# Patient Record
Sex: Female | Born: 1980 | Race: Black or African American | Hispanic: No | Marital: Single | Smoking: Current some day smoker
Health system: Southern US, Community
[De-identification: ages and names within clinical notes are randomized; demographics above are authoritative.]

## PROBLEM LIST (undated history)

## (undated) DIAGNOSIS — F1910 Other psychoactive substance abuse, uncomplicated: Secondary | ICD-10-CM

## (undated) DIAGNOSIS — C539 Malignant neoplasm of cervix uteri, unspecified: Secondary | ICD-10-CM

## (undated) HISTORY — PX: TUBAL LIGATION: SHX77

---

## 2016-09-15 ENCOUNTER — Encounter (HOSPITAL_COMMUNITY): Payer: Self-pay | Admitting: Emergency Medicine

## 2016-09-15 ENCOUNTER — Emergency Department (HOSPITAL_COMMUNITY): Payer: Medicaid Other

## 2016-09-15 DIAGNOSIS — F17210 Nicotine dependence, cigarettes, uncomplicated: Secondary | ICD-10-CM | POA: Insufficient documentation

## 2016-09-15 DIAGNOSIS — Z8541 Personal history of malignant neoplasm of cervix uteri: Secondary | ICD-10-CM | POA: Insufficient documentation

## 2016-09-15 DIAGNOSIS — R0789 Other chest pain: Secondary | ICD-10-CM | POA: Diagnosis not present

## 2016-09-15 DIAGNOSIS — R079 Chest pain, unspecified: Secondary | ICD-10-CM | POA: Diagnosis present

## 2016-09-15 LAB — COMPREHENSIVE METABOLIC PANEL
ALBUMIN: 3.8 g/dL (ref 3.5–5.0)
ALK PHOS: 81 U/L (ref 38–126)
ALT: 91 U/L — ABNORMAL HIGH (ref 14–54)
ANION GAP: 8 (ref 5–15)
AST: 65 U/L — ABNORMAL HIGH (ref 15–41)
BILIRUBIN TOTAL: 0.5 mg/dL (ref 0.3–1.2)
BUN: 17 mg/dL (ref 6–20)
CALCIUM: 9.3 mg/dL (ref 8.9–10.3)
CO2: 26 mmol/L (ref 22–32)
Chloride: 103 mmol/L (ref 101–111)
Creatinine, Ser: 0.91 mg/dL (ref 0.44–1.00)
GLUCOSE: 108 mg/dL — AB (ref 65–99)
Potassium: 4.4 mmol/L (ref 3.5–5.1)
Sodium: 137 mmol/L (ref 135–145)
TOTAL PROTEIN: 7.5 g/dL (ref 6.5–8.1)

## 2016-09-15 LAB — CBC
HCT: 42.3 % (ref 36.0–46.0)
HEMOGLOBIN: 14 g/dL (ref 12.0–15.0)
MCH: 29 pg (ref 26.0–34.0)
MCHC: 33.1 g/dL (ref 30.0–36.0)
MCV: 87.6 fL (ref 78.0–100.0)
Platelets: 231 10*3/uL (ref 150–400)
RBC: 4.83 MIL/uL (ref 3.87–5.11)
RDW: 14.7 % (ref 11.5–15.5)
WBC: 10.1 10*3/uL (ref 4.0–10.5)

## 2016-09-15 LAB — LIPASE, BLOOD: Lipase: 28 U/L (ref 11–51)

## 2016-09-15 LAB — I-STAT TROPONIN, ED: TROPONIN I, POC: 0 ng/mL (ref 0.00–0.08)

## 2016-09-15 MED ORDER — ONDANSETRON 4 MG PO TBDP
4.0000 mg | ORAL_TABLET | Freq: Once | ORAL | Status: AC | PRN
  Administered 2016-09-15: 4 mg via ORAL
  Filled 2016-09-15: qty 1

## 2016-09-15 NOTE — ED Triage Notes (Signed)
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

## 2016-09-15 NOTE — ED Triage Notes (Signed)
Pt comes from home via EMS with complaints of abdominal pain, chest pain, and left shoulder pain for the past 3 days.  Was diagnosed with pneumonia 3 weeks ago but did not take her antibiotics because she was "strung out on drugs".

## 2016-09-16 ENCOUNTER — Encounter (HOSPITAL_COMMUNITY): Payer: Self-pay

## 2016-09-16 ENCOUNTER — Emergency Department (HOSPITAL_COMMUNITY)
Admission: EM | Admit: 2016-09-16 | Discharge: 2016-09-16 | Disposition: A | Discharge status: Home / Self Care | Payer: Medicaid Other | Attending: Emergency Medicine | Admitting: Emergency Medicine

## 2016-09-16 ENCOUNTER — Emergency Department (HOSPITAL_COMMUNITY): Payer: Medicaid Other

## 2016-09-16 DIAGNOSIS — R0789 Other chest pain: Secondary | ICD-10-CM

## 2016-09-16 HISTORY — DX: Malignant neoplasm of cervix uteri, unspecified: C53.9

## 2016-09-16 HISTORY — DX: Other psychoactive substance abuse, uncomplicated: F19.10

## 2016-09-16 LAB — GC/CHLAMYDIA PROBE AMP (~~LOC~~) NOT AT ARMC
Chlamydia: NEGATIVE
Neisseria Gonorrhea: NEGATIVE

## 2016-09-16 LAB — URINALYSIS, ROUTINE W REFLEX MICROSCOPIC
Bilirubin Urine: NEGATIVE
GLUCOSE, UA: NEGATIVE mg/dL
HGB URINE DIPSTICK: NEGATIVE
KETONES UR: NEGATIVE mg/dL
Leukocytes, UA: NEGATIVE
Nitrite: NEGATIVE
PROTEIN: NEGATIVE mg/dL
Specific Gravity, Urine: 1.025 (ref 1.005–1.030)
pH: 7.5 (ref 5.0–8.0)

## 2016-09-16 LAB — WET PREP, GENITAL
Clue Cells Wet Prep HPF POC: NONE SEEN
SPERM: NONE SEEN
Trich, Wet Prep: NONE SEEN
Yeast Wet Prep HPF POC: NONE SEEN

## 2016-09-16 LAB — POC URINE PREG, ED: PREG TEST UR: NEGATIVE

## 2016-09-16 MED ORDER — METHOCARBAMOL 500 MG PO TABS: 500.0000 mg | ORAL_TABLET | Freq: Two times a day (BID) | ORAL | 0 refills | Status: DC

## 2016-09-16 MED ORDER — IOPAMIDOL (ISOVUE-370) INJECTION 76%
INTRAVENOUS | Status: AC
  Filled 2016-09-16: qty 100

## 2016-09-16 MED ORDER — SODIUM CHLORIDE 0.9 % IJ SOLN
INTRAMUSCULAR | Status: AC
  Filled 2016-09-16: qty 50

## 2016-09-16 MED ORDER — KETOROLAC TROMETHAMINE 60 MG/2ML IM SOLN
60.0000 mg | Freq: Once | INTRAMUSCULAR | Status: AC
  Administered 2016-09-16: 60 mg via INTRAMUSCULAR
  Filled 2016-09-16: qty 2

## 2016-09-16 MED ORDER — IOPAMIDOL (ISOVUE-370) INJECTION 76%
100.0000 mL | Freq: Once | INTRAVENOUS | Status: AC | PRN
  Administered 2016-09-16: 100 mL via INTRAVENOUS

## 2016-09-16 MED ORDER — NAPROXEN 375 MG PO TABS: 375.0000 mg | ORAL_TABLET | Freq: Two times a day (BID) | ORAL | 0 refills | Status: DC

## 2016-09-16 NOTE — ED Provider Notes (Signed)
[facility] DEPT Provider Note   CSN: [id number] Arrival date & time: 09/15/16  2101  By signing my name below, I, Beverly Hanna, attest that this documentation has been prepared under the direction and in the presence of [name], MD.  Electronically Signed: Julien Hanna, ED Scribe. 09/16/16. 2:01 AM.    History   Chief Complaint Chief Complaint  Patient presents with  . Abdominal Pain  . Chest Pain    The history is provided by the patient and the EMS personnel. No language interpreter was used.  Abdominal Pain   This is a new problem. The current episode started more than 2 days ago. The problem occurs rarely. The problem has been gradually worsening. The pain is associated with an unknown factor. The pain is located in the suprapubic region. The pain is moderate. Pertinent negatives include diarrhea and dysuria. Nothing aggravates the symptoms. Nothing relieves the symptoms. Past workup includes CT scan. Her past medical history does not include ulcerative colitis.  Chest Pain   This is a new problem. The current episode started more than 2 days ago. The problem occurs rarely. The problem has been gradually worsening. The pain is moderate. The pain radiates to the upper back. Associated symptoms include abdominal pain. Pertinent negatives include no diaphoresis and no shortness of breath. She has tried nothing for the symptoms. The treatment provided no relief. Risk factors include substance abuse.  Her past medical history is significant for cancer.   HPI Comments: Beverly Hanna is a 35 y.o. female who has a PMHx cervical cancer and substance abuse presents to the Emergency Department by EMS complaining of sudden, gradual worsening, left upper chest pain that radiates into her left shoulder x 3 days. She reports associated suprapubic abdominal pain, vaginal discharge, and abnormal loose stool. Pt has been cancer free for 1.5 years and received radiation and chemotherapy.  She is a recovering drug addict and has been drug free for the past 3 weeks. She has an abdominal surgical hx of tubal ligation. Pt is not on any birth control and has not had any recent prolonged travel or surgeries. She has not has any injury to the area. Denies dysuria and constipation.  Past Medical History:  Diagnosis Date  . Cervical cancer ([place])   . Substance abuse     There are no active problems to display for this patient.   History reviewed. No pertinent surgical history.  OB History    No data available       Home Medications    Prior to Admission medications   Not on File    Family History No family history on file.  Social History Social History  Substance Use Topics  . Smoking status: Current Some Day Smoker    Types: Cigarettes  . Smokeless tobacco: Never Used  . Alcohol use Yes     Comment: 3 weeks clean     Allergies   Patient has no known allergies.   Review of Systems Review of Systems  Constitutional: Negative for diaphoresis.  Respiratory: Negative for shortness of breath.   Cardiovascular: Positive for chest pain.  Gastrointestinal: Positive for abdominal pain. Negative for blood in stool and diarrhea.  Genitourinary: Positive for vaginal discharge. Negative for difficulty urinating, dysuria and genital sores.  All other systems reviewed and are negative.    Physical Exam Updated Vital Signs BP 103/82   Pulse 113   Temp 98 F (36.7 C) (Oral)   Resp 18   SpO2 100%  Physical Exam  Constitutional: She is oriented to person, place, and time. She appears well-developed and well-nourished.  HENT:  Head: Normocephalic and atraumatic.  Mouth/Throat: Oropharynx is clear and moist. No oropharyngeal exudate.  Eyes: Conjunctivae and EOM are normal. Pupils are equal, round, and reactive to light.  Neck: Normal range of motion. Neck supple. No JVD present. No tracheal deviation present.  No carotid bruits. Trachea midline.     Cardiovascular: Normal rate, regular rhythm and normal heart sounds.  Exam reveals no gallop and no friction rub.   No murmur heard. RRR.   Pulmonary/Chest: Effort normal and breath sounds normal. No stridor. No respiratory distress. She has no wheezes. She has no rales. She exhibits tenderness.  Lungs CTA bilaterally.   Abdominal: Soft. Bowel sounds are normal. She exhibits no distension. There is no rebound and no guarding.  Musculoskeletal: Normal range of motion.  Lymphadenopathy:    She has no cervical adenopathy.  Neurological: She is alert and oriented to person, place, and time. She has normal reflexes. She displays normal reflexes.  Skin: Skin is warm and dry. Capillary refill takes less than 2 seconds.  Psychiatric: She has a normal mood and affect.  Nursing note and vitals reviewed.    ED Treatments / Results   Vitals:   09/15/16 2112  BP: 103/82  Pulse: 113  Resp: 18  Temp: 98 F (36.7 C)    DIAGNOSTIC STUDIES: Oxygen Saturation is 100% on RA, normal by my interpretation.  COORDINATION OF CARE:  1:55 AM Discussed treatment plan with pt at bedside and pt agreed to plan.  Results for orders placed or performed during the hospital encounter of 09/16/16  Lipase, blood  Result Value Ref Range   Lipase 28 11 - 51 U/L  Comprehensive metabolic panel  Result Value Ref Range   Sodium 137 135 - 145 mmol/L   Potassium 4.4 3.5 - 5.1 mmol/L   Chloride 103 101 - 111 mmol/L   CO2 26 22 - 32 mmol/L   Glucose, Bld 108 (H) 65 - 99 mg/dL   BUN 17 6 - 20 mg/dL   Creatinine, Ser 0.91 0.44 - 1.00 mg/dL   Calcium 9.3 8.9 - 10.3 mg/dL   Total Protein 7.5 6.5 - 8.1 g/dL   Albumin 3.8 3.5 - 5.0 g/dL   AST 65 (H) 15 - 41 U/L   ALT 91 (H) 14 - 54 U/L   Alkaline Phosphatase 81 38 - 126 U/L   Total Bilirubin 0.5 0.3 - 1.2 mg/dL   GFR calc non Af Amer >60 >60 mL/min   GFR calc Af Amer >60 >60 mL/min   Anion gap 8 5 - 15  CBC  Result Value Ref Range   WBC 10.1 4.0 - 10.5 K/uL    RBC 4.83 3.87 - 5.11 MIL/uL   Hemoglobin 14.0 12.0 - 15.0 g/dL   HCT 42.3 36.0 - 46.0 %   MCV 87.6 78.0 - 100.0 fL   MCH 29.0 26.0 - 34.0 pg   MCHC 33.1 30.0 - 36.0 g/dL   RDW 14.7 11.5 - 15.5 %   Platelets 231 150 - 400 K/uL  Urinalysis, Routine w reflex microscopic (not at [facility])  Result Value Ref Range   Color, Urine YELLOW YELLOW   APPearance CLEAR CLEAR   Specific Gravity, Urine 1.025 1.005 - 1.030   pH 7.5 5.0 - 8.0   Glucose, UA NEGATIVE NEGATIVE mg/dL   Hgb urine dipstick NEGATIVE NEGATIVE   Bilirubin Urine NEGATIVE NEGATIVE  Ketones, ur NEGATIVE NEGATIVE mg/dL   Protein, ur NEGATIVE NEGATIVE mg/dL   Nitrite NEGATIVE NEGATIVE   Leukocytes, UA NEGATIVE NEGATIVE  I-stat troponin, ED  Result Value Ref Range   Troponin i, poc 0.00 0.00 - 0.08 ng/mL   Comment 3          POC urine preg, ED (not at [facility])  Result Value Ref Range   Preg Test, Ur NEGATIVE NEGATIVE   Dg Chest 2 View  Result Date: 09/15/2016 CLINICAL DATA:  Left-side chest pain, shortness of breath on exertion and headache for 2 days. EXAM: CHEST  2 VIEW COMPARISON:  None. FINDINGS: Lungs are clear. Heart size is normal. No pneumothorax or pleural effusion. No bony abnormality. IMPRESSION: Negative chest. Electronically Signed   By: Inge Rise M.D.   On: 09/15/2016 21:52   Ct Angio Chest Pe W And/or Wo Contrast  Result Date: 09/16/2016 CLINICAL DATA:  Left upper chest pain radiating to the shoulder for 3 days. Suprapubic abdominal pain. EXAM: CT ANGIOGRAPHY CHEST CT ABDOMEN AND PELVIS WITH CONTRAST TECHNIQUE: Multidetector CT imaging of the chest was performed using the standard protocol during bolus administration of intravenous contrast. Multiplanar CT image reconstructions and MIPs were obtained to evaluate the vascular anatomy. Multidetector CT imaging of the abdomen and pelvis was performed using the standard protocol during bolus administration of intravenous contrast. CONTRAST:  100 mL Isovue 370  intravenous COMPARISON:  None. FINDINGS: CTA CHEST FINDINGS Cardiovascular: Satisfactory opacification of the pulmonary arteries to the segmental level. No evidence of pulmonary embolism. Normal heart size. No pericardial effusion. Mediastinum/Nodes: No enlarged mediastinal, hilar, or axillary lymph nodes. Thyroid gland, trachea, and esophagus demonstrate no significant findings. Lungs/Pleura: Lungs are clear. No pleural effusion or pneumothorax. Musculoskeletal: No chest wall abnormality. No acute or significant osseous findings. Review of the MIP images confirms the above findings. CT ABDOMEN and PELVIS FINDINGS Hepatobiliary: No focal liver abnormality is seen. No gallstones, gallbladder wall thickening, or biliary dilatation. Pancreas: Unremarkable. No pancreatic ductal dilatation or surrounding inflammatory changes. Spleen: Normal in size without focal abnormality. Adrenals/Urinary Tract: Adrenal glands are unremarkable. Kidneys are normal, without renal calculi, focal lesion, or hydronephrosis. Bladder is unremarkable. Stomach/Bowel: Stomach is within normal limits. Appendix is normal. No evidence of bowel wall thickening, distention, or inflammatory changes. Vascular/Lymphatic: No significant vascular findings are present. No enlarged abdominal or pelvic lymph nodes. Reproductive: Uterus and bilateral adnexa are unremarkable. Other: No acute inflammatory changes.  No ascites. Musculoskeletal: No acute or significant osseous findings. Review of the MIP images confirms the above findings. IMPRESSION: No significant abnormality. Electronically Signed   By: Andreas Newport M.D.   On: 09/16/2016 04:39   Ct Abdomen Pelvis W Contrast  Result Date: 09/16/2016 CLINICAL DATA:  Left upper chest pain radiating to the shoulder for 3 days. Suprapubic abdominal pain. EXAM: CT ANGIOGRAPHY CHEST CT ABDOMEN AND PELVIS WITH CONTRAST TECHNIQUE: Multidetector CT imaging of the chest was performed using the standard protocol  during bolus administration of intravenous contrast. Multiplanar CT image reconstructions and MIPs were obtained to evaluate the vascular anatomy. Multidetector CT imaging of the abdomen and pelvis was performed using the standard protocol during bolus administration of intravenous contrast. CONTRAST:  100 mL Isovue 370 intravenous COMPARISON:  None. FINDINGS: CTA CHEST FINDINGS Cardiovascular: Satisfactory opacification of the pulmonary arteries to the segmental level. No evidence of pulmonary embolism. Normal heart size. No pericardial effusion. Mediastinum/Nodes: No enlarged mediastinal, hilar, or axillary lymph nodes. Thyroid gland, trachea, and esophagus demonstrate no significant  findings. Lungs/Pleura: Lungs are clear. No pleural effusion or pneumothorax. Musculoskeletal: No chest wall abnormality. No acute or significant osseous findings. Review of the MIP images confirms the above findings. CT ABDOMEN and PELVIS FINDINGS Hepatobiliary: No focal liver abnormality is seen. No gallstones, gallbladder wall thickening, or biliary dilatation. Pancreas: Unremarkable. No pancreatic ductal dilatation or surrounding inflammatory changes. Spleen: Normal in size without focal abnormality. Adrenals/Urinary Tract: Adrenal glands are unremarkable. Kidneys are normal, without renal calculi, focal lesion, or hydronephrosis. Bladder is unremarkable. Stomach/Bowel: Stomach is within normal limits. Appendix is normal. No evidence of bowel wall thickening, distention, or inflammatory changes. Vascular/Lymphatic: No significant vascular findings are present. No enlarged abdominal or pelvic lymph nodes. Reproductive: Uterus and bilateral adnexa are unremarkable. Other: No acute inflammatory changes.  No ascites. Musculoskeletal: No acute or significant osseous findings. Review of the MIP images confirms the above findings. IMPRESSION: No significant abnormality. Electronically Signed   By: Andreas Newport M.D.   On: 09/16/2016  04:39    EKG  EKG Interpretation  Date/Time:  Monday September 15 2016 21:19:02 EST Ventricular Rate:  112 PR Interval:    QRS Duration: 83 QT Interval:  321 QTC Calculation: 439 R Axis:   79 Text Interpretation:  Sinus tachycardia Confirmed by [facility]  MD, Suhani Stillion ([id number]) on 09/16/2016 1:45:47 AM        Procedures (including critical care time)  Medications Ordered in ED Medications  ondansetron (ZOFRAN-ODT) disintegrating tablet 4 mg (4 mg Oral Given 09/15/16 2126)   Medications  sodium chloride 0.9 % injection (not administered)  iopamidol (ISOVUE-370) 76 % injection (not administered)  ondansetron (ZOFRAN-ODT) disintegrating tablet 4 mg (4 mg Oral Given 09/15/16 2126)  ketorolac (TORADOL) injection 60 mg (60 mg Intramuscular Given 09/16/16 0229)  iopamidol (ISOVUE-370) 76 % injection 100 mL (100 mLs Intravenous Contrast Given 09/16/16 0407)     Final Clinical Impressions(s) / ED Diagnoses  Patient is very pleased with care.  I suspect she wanted to make sure her cancer had not recurred.  She now admits to doing a lot of heavy lifting before the pain in her chest and groin started.  No PE on CT.  Ruled out for MI with negative EKG and troponin and > 24 hours of continuous pain.  Pain is also highly reproducible and consistent will MSK pain.  Will treat for same.  All questions answered to patient's satisfaction. Based on history and exam patient has been appropriately medically screened and emergency conditions excluded. Patient is stable for discharge at this time. Follow up with your PMD for recheck in 2 days and strict return precautions given.   I personally performed the services described in this documentation, which was scribed in my presence. The recorded information has been reviewed and is accurate.       Veatrice Kells, MD 09/16/16 [phone]

## 2016-10-13 ENCOUNTER — Emergency Department (HOSPITAL_COMMUNITY)
Admission: EM | Admit: 2016-10-13 | Discharge: 2016-10-13 | Disposition: A | Discharge status: Home / Self Care | Payer: Medicaid Other | Attending: Emergency Medicine | Admitting: Emergency Medicine

## 2016-10-13 ENCOUNTER — Emergency Department (HOSPITAL_COMMUNITY): Payer: Medicaid Other

## 2016-10-13 ENCOUNTER — Encounter (HOSPITAL_COMMUNITY): Payer: Self-pay | Admitting: Emergency Medicine

## 2016-10-13 DIAGNOSIS — R079 Chest pain, unspecified: Secondary | ICD-10-CM | POA: Diagnosis not present

## 2016-10-13 DIAGNOSIS — M79602 Pain in left arm: Secondary | ICD-10-CM | POA: Diagnosis present

## 2016-10-13 DIAGNOSIS — Z8541 Personal history of malignant neoplasm of cervix uteri: Secondary | ICD-10-CM | POA: Diagnosis not present

## 2016-10-13 DIAGNOSIS — Z202 Contact with and (suspected) exposure to infections with a predominantly sexual mode of transmission: Secondary | ICD-10-CM

## 2016-10-13 DIAGNOSIS — M62838 Other muscle spasm: Secondary | ICD-10-CM | POA: Diagnosis not present

## 2016-10-13 DIAGNOSIS — F17210 Nicotine dependence, cigarettes, uncomplicated: Secondary | ICD-10-CM | POA: Diagnosis not present

## 2016-10-13 DIAGNOSIS — L03112 Cellulitis of left axilla: Secondary | ICD-10-CM

## 2016-10-13 DIAGNOSIS — Z79899 Other long term (current) drug therapy: Secondary | ICD-10-CM | POA: Insufficient documentation

## 2016-10-13 LAB — BASIC METABOLIC PANEL
Anion gap: 8 (ref 5–15)
BUN: 15 mg/dL (ref 6–20)
CHLORIDE: 105 mmol/L (ref 101–111)
CO2: 25 mmol/L (ref 22–32)
Calcium: 9 mg/dL (ref 8.9–10.3)
Creatinine, Ser: 0.77 mg/dL (ref 0.44–1.00)
Glucose, Bld: 102 mg/dL — ABNORMAL HIGH (ref 65–99)
POTASSIUM: 4 mmol/L (ref 3.5–5.1)
SODIUM: 138 mmol/L (ref 135–145)

## 2016-10-13 LAB — CBC
HEMATOCRIT: 37.6 % (ref 36.0–46.0)
Hemoglobin: 12.4 g/dL (ref 12.0–15.0)
MCH: 28.8 pg (ref 26.0–34.0)
MCHC: 33 g/dL (ref 30.0–36.0)
MCV: 87.4 fL (ref 78.0–100.0)
PLATELETS: 260 10*3/uL (ref 150–400)
RBC: 4.3 MIL/uL (ref 3.87–5.11)
RDW: 15 % (ref 11.5–15.5)
WBC: 6.7 10*3/uL (ref 4.0–10.5)

## 2016-10-13 LAB — URINALYSIS, ROUTINE W REFLEX MICROSCOPIC
BILIRUBIN URINE: NEGATIVE
Glucose, UA: NEGATIVE mg/dL
HGB URINE DIPSTICK: NEGATIVE
KETONES UR: NEGATIVE mg/dL
Leukocytes, UA: NEGATIVE
NITRITE: NEGATIVE
PH: 6 (ref 5.0–8.0)
Protein, ur: NEGATIVE mg/dL
SPECIFIC GRAVITY, URINE: 1.025 (ref 1.005–1.030)

## 2016-10-13 LAB — I-STAT TROPONIN, ED: Troponin i, poc: 0 ng/mL (ref 0.00–0.08)

## 2016-10-13 LAB — RAPID URINE DRUG SCREEN, HOSP PERFORMED
Amphetamines: NOT DETECTED
BARBITURATES: NOT DETECTED
Benzodiazepines: NOT DETECTED
COCAINE: NOT DETECTED
Opiates: NOT DETECTED
TETRAHYDROCANNABINOL: POSITIVE — AB

## 2016-10-13 LAB — POC URINE PREG, ED: Preg Test, Ur: NEGATIVE

## 2016-10-13 LAB — RAPID HIV SCREEN (HIV 1/2 AB+AG)
HIV 1/2 ANTIBODIES: NONREACTIVE
HIV-1 P24 ANTIGEN - HIV24: NONREACTIVE

## 2016-10-13 MED ORDER — METHOCARBAMOL 500 MG PO TABS
500.0000 mg | ORAL_TABLET | Freq: Once | ORAL | Status: AC
  Administered 2016-10-13: 500 mg via ORAL
  Filled 2016-10-13: qty 1

## 2016-10-13 MED ORDER — METHOCARBAMOL 500 MG PO TABS: 500.0000 mg | ORAL_TABLET | Freq: Two times a day (BID) | ORAL | 0 refills | Status: DC

## 2016-10-13 MED ORDER — NAPROXEN 500 MG PO TABS: 500.0000 mg | ORAL_TABLET | Freq: Two times a day (BID) | ORAL | 0 refills | Status: DC

## 2016-10-13 MED ORDER — CEPHALEXIN 500 MG PO CAPS: 500.0000 mg | ORAL_CAPSULE | Freq: Four times a day (QID) | ORAL | 0 refills | Status: DC

## 2016-10-13 NOTE — Discharge Instructions (Signed)
1. Medications: Keflex antibiotics for the cellulitis in her left axilla, Naprosyn for inflammation of your neck and shoulder, Robaxin for muscle spasm, usual home medications 2. Treatment: rest, drink plenty of fluids, gentle stretching of the neck and shoulder 3. Follow Up: Please followup with your primary doctor in 2-3 days for discussion of your diagnoses and further evaluation after today's visit; if you do not have a primary care doctor use the resource guide provided to find one; Please return to the ER for worsening symptoms including chest pain that is associated with nausea, diaphoresis, shortness of breath or other concerns

## 2016-10-13 NOTE — ED Triage Notes (Signed)
Patient reports intermittent left chest pain radiating to left shoulder and left neck for 2 weeks , pt. Added abscess at left axilla onset this week with no drainage .

## 2016-10-13 NOTE — ED Provider Notes (Signed)
[place] DEPT Provider Note   CSN: [id number] Arrival date & time: 10/13/16  0145     History   Chief Complaint Chief Complaint  Patient presents with  . Chest Pain  . Abscess    Left Axilla    HPI Beverly Hanna is a 36 y.o. female with a hx of cervical cancer and substance abuse presents to the Emergency Department complaining of gradual, persistent, progressively worsening left shoulder, neck and arm pain onset 2 weeks ago.  Pt reports the pain has been constant, waxing and waning without resolution. She states she the pain radiates up into the left side of her head and into her left arm. She denies numbness, weakness of the LUE extremity.  She denies new activities, falls or known trauma.  She reports laying on her right side makes the pain better and laying on her left side makes it worse. No treatment PTA.    Pt reports development of 2 sore sites to the left axilla. She reports they are hard and tender. Denies drainage from the site or previous abscesses.  She denies fever, chills, abd pain, N/V/D.    Pt also requesting HIV test. She reports a previous partner was diagnosed about 2 mos ago. She has not yet been tested.    The history is provided by the patient and medical records. No language interpreter was used.    Past Medical History:  Diagnosis Date  . Cervical cancer ([place])   . Substance abuse     There are no active problems to display for this patient.   History reviewed. No pertinent surgical history.  OB History    No data available       Home Medications    Prior to Admission medications   Medication Sig Start Date End Date Taking? Authorizing Provider  cephALEXin (KEFLEX) 500 MG capsule Take 1 capsule (500 mg total) by mouth 4 (four) times daily. 10/13/16   Tyechia Allmendinger, PA-C  methocarbamol (ROBAXIN) 500 MG tablet Take 1 tablet (500 mg total) by mouth 2 (two) times daily. 10/13/16   Jassiel Flye, PA-C  naproxen (NAPROSYN) 500 MG  tablet Take 1 tablet (500 mg total) by mouth 2 (two) times daily with a meal. 10/13/16   Jarrett Soho Keslee Harrington, PA-C    Family History No family history on file.  Social History Social History  Substance Use Topics  . Smoking status: Current Some Day Smoker    Types: Cigarettes  . Smokeless tobacco: Never Used  . Alcohol use Yes     Comment: 3 weeks clean     Allergies   Patient has no known allergies.   Review of Systems Review of Systems  Cardiovascular: Positive for chest pain.  Musculoskeletal: Positive for arthralgias (left shoulder) and neck pain.  Skin:       Abscess - left axilla  All other systems reviewed and are negative.    Physical Exam Updated Vital Signs BP 112/93   Pulse 79   Temp 97.8 F (36.6 C) (Oral)   Resp 22   SpO2 100%   Physical Exam  Constitutional: She appears well-developed and well-nourished. No distress.  Awake, alert, nontoxic appearance  HENT:  Head: Normocephalic and atraumatic.  Mouth/Throat: Oropharynx is clear and moist. No oropharyngeal exudate.  Eyes: Conjunctivae are normal. No scleral icterus.  Neck: Normal range of motion. Neck supple. Muscular tenderness present. No spinous process tenderness present.    TTP of the left paraspinal tenderness; TTP over the left trapezius with reproduction  of pain  Cardiovascular: Normal rate, regular rhythm and intact distal pulses.   Pulmonary/Chest: Effort normal and breath sounds normal. No respiratory distress. She has no wheezes.  Equal chest expansion  Abdominal: Soft. Bowel sounds are normal. She exhibits no mass. There is no tenderness. There is no rebound and no guarding.  Musculoskeletal: Normal range of motion. She exhibits no edema.       Left shoulder: She exhibits pain. She exhibits normal range of motion, no tenderness, no bony tenderness, no swelling and no deformity.       Left elbow: She exhibits normal range of motion, no swelling, no effusion and no deformity. No  tenderness found.       Left wrist: She exhibits normal range of motion, no tenderness, no bony tenderness, no swelling and no deformity.  No TTP of the left shoulder or left arm  Neurological: She is alert.  Speech is clear and goal oriented Moves extremities without ataxia  Skin: Skin is warm and dry. She is not diaphoretic.  Left axilla: mild erythema with minor induration; no palpable fluctuance  Psychiatric: She has a normal mood and affect.  Nursing note and vitals reviewed.    ED Treatments / Results  Labs (all labs ordered are listed, but only abnormal results are displayed) Labs Reviewed  BASIC METABOLIC PANEL - Abnormal; Notable for the following:       Result Value   Glucose, Bld 102 (*)    All other components within normal limits  RAPID URINE DRUG SCREEN, HOSP PERFORMED - Abnormal; Notable for the following:    Tetrahydrocannabinol POSITIVE (*)    All other components within normal limits  CBC  RAPID HIV SCREEN (HIV 1/2 AB+AG)  URINALYSIS, ROUTINE W REFLEX MICROSCOPIC  I-STAT TROPOININ, ED  I-STAT TROPOININ, ED  POC URINE PREG, ED    EKG  EKG Interpretation  Date/Time:  Monday October 13 2016 01:56:12 EST Ventricular Rate:  85 PR Interval:  132 QRS Duration: 86 QT Interval:  390 QTC Calculation: 464 R Axis:   71 Text Interpretation:  Normal sinus rhythm Normal ECG tachcyardia resolved Confirmed by Glynn Octave [phone]) on 10/13/2016 2:07:11 AM       Radiology Dg Chest 2 View  Result Date: 10/13/2016 CLINICAL DATA:  Left-sided chest pain with cough EXAM: CHEST  2 VIEW COMPARISON:  09/16/2016, 09/15/2016 FINDINGS: The heart size and mediastinal contours are within normal limits. Both lungs are clear. The visualized skeletal structures are unremarkable. IMPRESSION: No active cardiopulmonary disease. Electronically Signed   By: Donavan Foil M.D.   On: 10/13/2016 02:42   Dg Cervical Spine Complete  Result Date: 10/13/2016 CLINICAL DATA:  Fall, neck  pain. EXAM: CERVICAL SPINE - COMPLETE 4+ VIEW COMPARISON:  None. FINDINGS: Cervical vertebral bodies and posterior elements appear intact and aligned to the inferior endplate of C7, the most caudal well visualized level. Straightened cervical lordosis. Mild C3-4 disc height loss. Multilevel mild ventral endplate spurring. No neural foraminal narrowing. No destructive bony lesions. Lateral masses in alignment. Fullness of the nasopharyngeal soft tissues. IMPRESSION: Mild degenerative change without acute fracture or malalignment. Fullness of the nasopharyngeal soft tissues associated with recent viral illness and immunocompromised states. Electronically Signed   By: Elon Alas M.D.   On: 10/13/2016 03:42    Procedures Procedures (including critical care time)  EMERGENCY DEPARTMENT US SOFT TISSUE INTERPRETATION "Study: Limited Ultrasound of the noted body part in comments below"  INDICATIONS: Pain Multiple views of the body part are  obtained with a multi-frequency linear probe  PERFORMED BY:  Myself  IMAGES ARCHIVED?: Yes  SIDE:Left  BODY PART:Axilla  FINDINGS: No abcess noted  LIMITATIONS:  Body Habitus  INTERPRETATION:  No abcess noted  COMMENT:  No fluid collection noted, no cobblestoning noted either    Medications Ordered in ED Medications  methocarbamol (ROBAXIN) tablet 500 mg (500 mg Oral Given 10/13/16 [id number])     Initial Impression / Assessment and Plan / ED Course  I have reviewed the triage vital signs and the nursing notes.  Pertinent labs & imaging results that were available during my care of the patient were reviewed by me and considered in my medical decision making (see chart for details).  Clinical Course as of Oct 13 610  Mon Oct 13, 2016  0301 NSR EKG 12-Lead [HM]    Clinical Course User Index [HM] Jarrett Soho Terra Aveni, PA-C    He presents with multiple complaints. She is a cough and left-sided chest pain for 2 weeks. Negative EKG, chest x-ray and  troponin. I doubt cardiac or pulmonary etiology of this. No tachycardia and no risk factors for PE.  Some of patient's pain is reproducible with palpation of the trapezius. Will give Robaxin and naproxen. Robaxin was administered here in the emergency department and she feels significantly better.  Left axilla with signs of cellulitis but no abscess. Ultrasound used to determine no fluid collections. Patient will be discharged with Keflex.  Patient also with concerns about HIV exposure. HIV test negative. Emergency department. Discussed safe sex practices with patient. She states understanding and is in agreement with the plan for discharge home.   Final Clinical Impressions(s) / ED Diagnoses   Final diagnoses:  Cellulitis of left axilla  Chest pain, unspecified type  Trapezius muscle spasm  Possible exposure to STD    New Prescriptions Discharge Medication List as of 10/13/2016  5:49 AM    START taking these medications   Details  cephALEXin (KEFLEX) 500 MG capsule Take 1 capsule (500 mg total) by mouth 4 (four) times daily., Starting Mon 10/13/2016, [employer] Hands, PA-C 10/13/16 [id number]    Everlene Balls, MD 10/13/16 [phone]

## 2016-12-11 ENCOUNTER — Encounter (HOSPITAL_COMMUNITY): Payer: Self-pay

## 2016-12-11 ENCOUNTER — Emergency Department (HOSPITAL_COMMUNITY)
Admission: EM | Admit: 2016-12-11 | Discharge: 2016-12-11 | Disposition: A | Discharge status: Home / Self Care | Payer: Medicaid Other | Attending: Emergency Medicine | Admitting: Emergency Medicine

## 2016-12-11 DIAGNOSIS — Z5321 Procedure and treatment not carried out due to patient leaving prior to being seen by health care provider: Secondary | ICD-10-CM | POA: Insufficient documentation

## 2016-12-11 DIAGNOSIS — H9209 Otalgia, unspecified ear: Secondary | ICD-10-CM | POA: Diagnosis not present

## 2016-12-11 LAB — URINALYSIS, ROUTINE W REFLEX MICROSCOPIC
BACTERIA UA: NONE SEEN
BILIRUBIN URINE: NEGATIVE
Glucose, UA: NEGATIVE mg/dL
Ketones, ur: NEGATIVE mg/dL
Nitrite: NEGATIVE
Protein, ur: NEGATIVE mg/dL
SPECIFIC GRAVITY, URINE: 1.023 (ref 1.005–1.030)
pH: 5 (ref 5.0–8.0)

## 2016-12-11 LAB — POC URINE PREG, ED: Preg Test, Ur: NEGATIVE

## 2016-12-11 NOTE — ED Triage Notes (Signed)
Pt reports she found a bug in her ear. She also reports her sexual partner said he had tested positive for Chlamydia.

## 2017-01-19 ENCOUNTER — Encounter (HOSPITAL_BASED_OUTPATIENT_CLINIC_OR_DEPARTMENT_OTHER): Payer: Self-pay | Admitting: Emergency Medicine

## 2017-01-19 ENCOUNTER — Emergency Department (HOSPITAL_BASED_OUTPATIENT_CLINIC_OR_DEPARTMENT_OTHER)
Admission: EM | Admit: 2017-01-19 | Discharge: 2017-01-19 | Disposition: A | Discharge status: Home / Self Care | Payer: Medicaid Other | Attending: Emergency Medicine | Admitting: Emergency Medicine

## 2017-01-19 DIAGNOSIS — F17210 Nicotine dependence, cigarettes, uncomplicated: Secondary | ICD-10-CM | POA: Insufficient documentation

## 2017-01-19 DIAGNOSIS — B9689 Other specified bacterial agents as the cause of diseases classified elsewhere: Secondary | ICD-10-CM

## 2017-01-19 DIAGNOSIS — N760 Acute vaginitis: Secondary | ICD-10-CM | POA: Diagnosis not present

## 2017-01-19 DIAGNOSIS — Z8541 Personal history of malignant neoplasm of cervix uteri: Secondary | ICD-10-CM | POA: Diagnosis not present

## 2017-01-19 DIAGNOSIS — Z202 Contact with and (suspected) exposure to infections with a predominantly sexual mode of transmission: Secondary | ICD-10-CM

## 2017-01-19 DIAGNOSIS — Z79899 Other long term (current) drug therapy: Secondary | ICD-10-CM | POA: Insufficient documentation

## 2017-01-19 LAB — URINALYSIS, ROUTINE W REFLEX MICROSCOPIC
Bilirubin Urine: NEGATIVE
GLUCOSE, UA: NEGATIVE mg/dL
Hgb urine dipstick: NEGATIVE
KETONES UR: NEGATIVE mg/dL
LEUKOCYTES UA: NEGATIVE
NITRITE: NEGATIVE
PH: 6.5 (ref 5.0–8.0)
PROTEIN: NEGATIVE mg/dL
Specific Gravity, Urine: 1.019 (ref 1.005–1.030)

## 2017-01-19 LAB — WET PREP, GENITAL
SPERM: NONE SEEN
Trich, Wet Prep: NONE SEEN
Yeast Wet Prep HPF POC: NONE SEEN

## 2017-01-19 LAB — PREGNANCY, URINE: Preg Test, Ur: NEGATIVE

## 2017-01-19 MED ORDER — AZITHROMYCIN 250 MG PO TABS
1000.0000 mg | ORAL_TABLET | Freq: Once | ORAL | Status: AC
  Administered 2017-01-19: 1000 mg via ORAL
  Filled 2017-01-19: qty 4

## 2017-01-19 MED ORDER — CLINDAMYCIN HCL 300 MG PO CAPS
300.0000 mg | ORAL_CAPSULE | Freq: Two times a day (BID) | ORAL | 0 refills | Status: DC
Start: 2017-01-19 — End: 2017-02-13

## 2017-01-19 MED ORDER — CEFTRIAXONE SODIUM 250 MG IJ SOLR
250.0000 mg | Freq: Once | INTRAMUSCULAR | Status: AC
  Administered 2017-01-19: 250 mg via INTRAMUSCULAR
  Filled 2017-01-19: qty 250

## 2017-01-19 NOTE — ED Triage Notes (Signed)
Pt is from [facility], reports abd pain, also told by her boyfriend that he has chlamydia and syphilis.

## 2017-01-19 NOTE — ED Notes (Signed)
Patient verbalized that her boyfriend has Chlamydia.  Her boyfriend just told her yesterday.

## 2017-01-19 NOTE — ED Provider Notes (Signed)
[facility] DEPT [facility] Provider Note   CSN: [id number] Arrival date & time: 01/19/17  1755  By signing my name below, I, Beverly Hanna, attest that this documentation has been prepared under the direction and in the presence of Malvin Johns, MD. Electronically Signed: Jeanell Hanna, Scribe. 01/19/2017. 6:54 PM.  History   Chief Complaint Chief Complaint  Patient presents with  . Abdominal Pain  . Exposure to STD   The history is provided by the patient. No language interpreter was used.   HPI Comments: Beverly Hanna is a 36 y.o. female who presents to the Emergency Department for possible STD exposure. She reports her boyfriend told her he has chlamydia and syphilis. She reports associated abdominal pain, vomiting, and rhinorrhea. She admits to a prior hx of chlamydia. Denies any genital rash or other complaints at this time.  Past Medical History:  Diagnosis Date  . Cervical cancer ([place])   . Substance abuse     There are no active problems to display for this patient.   History reviewed. No pertinent surgical history.  OB History    No data available       Home Medications    Prior to Admission medications   Medication Sig Start Date End Date Taking? Authorizing Provider  disulfiram (ANTABUSE) 250 MG tablet Take by mouth daily.   Yes Historical Provider, MD  gabapentin (NEURONTIN) 400 MG capsule Take 400 mg by mouth 3 (three) times daily.   Yes Historical Provider, MD  QUEtiapine (SEROQUEL) 400 MG tablet Take 400 mg by mouth at bedtime.   Yes Historical Provider, MD  clindamycin (CLEOCIN) 300 MG capsule Take 1 capsule (300 mg total) by mouth 2 (two) times daily. X 7 days 01/19/17   Malvin Johns, MD  methocarbamol (ROBAXIN) 500 MG tablet Take 1 tablet (500 mg total) by mouth 2 (two) times daily. Patient not taking: Reported on 12/11/2016 10/13/16   Jarrett Soho Muthersbaugh, PA-C  naproxen (NAPROSYN) 500 MG tablet Take 1 tablet (500 mg total) by mouth 2 (two) times daily with a  meal. Patient not taking: Reported on 12/11/2016 10/13/16   Jarrett Soho Muthersbaugh, PA-C    Family History No family history on file.  Social History Social History  Substance Use Topics  . Smoking status: Current Some Day Smoker    Types: Cigarettes  . Smokeless tobacco: Never Used  . Alcohol use Yes     Comment: 3 weeks clean     Allergies   Patient has no known allergies.   Review of Systems Review of Systems  Constitutional: Negative for chills, diaphoresis, fatigue and fever.  HENT: Positive for rhinorrhea. Negative for congestion and sneezing.   Eyes: Negative.   Respiratory: Negative for cough, chest tightness and shortness of breath.   Cardiovascular: Negative for chest pain and leg swelling.  Gastrointestinal: Positive for abdominal pain and vomiting. Negative for blood in stool, diarrhea and nausea.  Genitourinary: Negative for difficulty urinating, flank pain, frequency and hematuria.  Musculoskeletal: Negative for arthralgias and back pain.  Skin: Negative for rash.  Neurological: Negative for dizziness, speech difficulty, weakness, numbness and headaches.     Physical Exam Updated Vital Signs BP (!) 124/98 (BP Location: Right Arm)   Pulse 96   Temp 98.4 F (36.9 C) (Oral)   Resp 18   Ht 5\' 2"  (1.575 m)   Wt 194 lb (88 kg)   SpO2 99%   BMI 35.48 kg/m   Physical Exam  Constitutional: She is oriented to person, place, and  time. She appears well-developed and well-nourished.  HENT:  Head: Normocephalic and atraumatic.  Eyes: Pupils are equal, round, and reactive to light.  Neck: Normal range of motion. Neck supple.  Cardiovascular: Normal rate, regular rhythm and normal heart sounds.   Pulmonary/Chest: Effort normal and breath sounds normal. No respiratory distress. She has no wheezes. She has no rales. She exhibits no tenderness.  Abdominal: Soft. Bowel sounds are normal. There is tenderness. There is no rebound and no guarding.  Mild TTP to lower abdomen  bilaterally.   Genitourinary:  Genitourinary Comments: Moderate amount of white discharge. There is some mild tenderness on exam but no adnexal tenderness or suggestions of PID.  Musculoskeletal: Normal range of motion. She exhibits no edema.  Lymphadenopathy:    She has no cervical adenopathy.  Neurological: She is alert and oriented to person, place, and time.  Skin: Skin is warm and dry. No rash noted.  Psychiatric: She has a normal mood and affect.     ED Treatments / Results  DIAGNOSTIC STUDIES: Oxygen Saturation is 99% on RA, normal by my interpretation.    COORDINATION OF CARE: 6:58 PM- Pt advised of plan for treatment and pt agrees.  Labs (all labs ordered are listed, but only abnormal results are displayed) Labs Reviewed  WET PREP, GENITAL - Abnormal; Notable for the following:       Result Value   Clue Cells Wet Prep HPF POC PRESENT (*)    WBC, Wet Prep HPF POC MODERATE (*)    All other components within normal limits  URINALYSIS, ROUTINE W REFLEX MICROSCOPIC  PREGNANCY, URINE  RPR  HIV ANTIBODY (ROUTINE TESTING)  GC/CHLAMYDIA PROBE AMP ([place]) NOT AT [facility]    EKG  EKG Interpretation None       Radiology No results found.  Procedures Procedures (including critical care time)  Medications Ordered in ED Medications  cefTRIAXone (ROCEPHIN) injection 250 mg (250 mg Intramuscular Given 01/19/17 1944)  azithromycin (ZITHROMAX) tablet 1,000 mg (1,000 mg Oral Given 01/19/17 1943)     Initial Impression / Assessment and Plan / ED Course  I have reviewed the triage vital signs and the nursing notes.  Pertinent labs & imaging results that were available during my care of the patient were reviewed by me and considered in my medical decision making (see chart for details).     Patient was treated presumptively with Rocephin and Zithromax. She also has clue cells on her wet prep and will be treated with clindamycin as she is currently on Antabuse, so Flagyl  would be contraindicated. She was given referral to follow-up with women's outpatient clinic if her symptoms are not improving. Return precautions given.  Final Clinical Impressions(s) / ED Diagnoses   Final diagnoses:  STD exposure  BV (bacterial vaginosis)    New Prescriptions New Prescriptions   CLINDAMYCIN (CLEOCIN) 300 MG CAPSULE    Take 1 capsule (300 mg total) by mouth 2 (two) times daily. X 7 days   I personally performed the services described in this documentation, which was scribed in my presence.  The recorded information has been reviewed and considered.     Malvin Johns, MD 01/19/17 1950

## 2017-01-20 LAB — GC/CHLAMYDIA PROBE AMP (~~LOC~~) NOT AT ARMC
CHLAMYDIA, DNA PROBE: NEGATIVE
NEISSERIA GONORRHEA: POSITIVE — AB

## 2017-01-20 LAB — RPR: RPR Ser Ql: NONREACTIVE

## 2017-01-20 LAB — HIV ANTIBODY (ROUTINE TESTING W REFLEX): HIV Screen 4th Generation wRfx: NONREACTIVE

## 2017-01-28 ENCOUNTER — Encounter (HOSPITAL_BASED_OUTPATIENT_CLINIC_OR_DEPARTMENT_OTHER): Payer: Self-pay | Admitting: Emergency Medicine

## 2017-01-28 DIAGNOSIS — Z79899 Other long term (current) drug therapy: Secondary | ICD-10-CM | POA: Diagnosis not present

## 2017-01-28 DIAGNOSIS — Y999 Unspecified external cause status: Secondary | ICD-10-CM | POA: Insufficient documentation

## 2017-01-28 DIAGNOSIS — W010XXA Fall on same level from slipping, tripping and stumbling without subsequent striking against object, initial encounter: Secondary | ICD-10-CM | POA: Insufficient documentation

## 2017-01-28 DIAGNOSIS — M25552 Pain in left hip: Secondary | ICD-10-CM | POA: Diagnosis not present

## 2017-01-28 DIAGNOSIS — Y929 Unspecified place or not applicable: Secondary | ICD-10-CM | POA: Insufficient documentation

## 2017-01-28 DIAGNOSIS — S79912A Unspecified injury of left hip, initial encounter: Secondary | ICD-10-CM | POA: Diagnosis present

## 2017-01-28 DIAGNOSIS — M25562 Pain in left knee: Secondary | ICD-10-CM | POA: Insufficient documentation

## 2017-01-28 DIAGNOSIS — F17210 Nicotine dependence, cigarettes, uncomplicated: Secondary | ICD-10-CM | POA: Diagnosis not present

## 2017-01-28 DIAGNOSIS — Y939 Activity, unspecified: Secondary | ICD-10-CM | POA: Diagnosis not present

## 2017-01-28 NOTE — ED Triage Notes (Signed)
PT presents to ED with c/o left thigh and left hip pain after falling tonight. PT is from [place].

## 2017-01-29 ENCOUNTER — Emergency Department (HOSPITAL_BASED_OUTPATIENT_CLINIC_OR_DEPARTMENT_OTHER): Payer: Medicaid Other

## 2017-01-29 ENCOUNTER — Emergency Department (HOSPITAL_BASED_OUTPATIENT_CLINIC_OR_DEPARTMENT_OTHER)
Admission: EM | Admit: 2017-01-29 | Discharge: 2017-01-29 | Disposition: A | Discharge status: Home / Self Care | Payer: Medicaid Other | Attending: Emergency Medicine | Admitting: Emergency Medicine

## 2017-01-29 ENCOUNTER — Encounter (HOSPITAL_BASED_OUTPATIENT_CLINIC_OR_DEPARTMENT_OTHER): Payer: Self-pay

## 2017-01-29 DIAGNOSIS — W19XXXA Unspecified fall, initial encounter: Secondary | ICD-10-CM

## 2017-01-29 MED ORDER — ACETAMINOPHEN 500 MG PO TABS
1000.0000 mg | ORAL_TABLET | Freq: Once | ORAL | Status: AC
Start: 2017-01-29 — End: 2017-01-29
  Administered 2017-01-29: 1000 mg via ORAL
  Filled 2017-01-29: qty 2

## 2017-01-29 MED ORDER — IBUPROFEN 800 MG PO TABS
800.0000 mg | ORAL_TABLET | Freq: Once | ORAL | Status: AC
  Administered 2017-01-29: 800 mg via ORAL
  Filled 2017-01-29: qty 1

## 2017-01-29 MED ORDER — IBUPROFEN 800 MG PO TABS: 800.0000 mg | ORAL_TABLET | Freq: Three times a day (TID) | ORAL | 0 refills | Status: DC

## 2017-01-29 NOTE — ED Notes (Signed)
Called [facility] to pick up patient, verbalizes understanding of dc instructions and denies any further needs at this time.

## 2017-01-29 NOTE — ED Notes (Signed)
Pt is a [facility] patient, states she slipped and her left leg went behind her.  Pt has ambulated and is able to bear weight on leg.  Pt also managed to get the phone down from the cabinet and plug it in and use it without any difficulty or increased pain.

## 2017-01-29 NOTE — ED Provider Notes (Signed)
[facility] DEPT [facility] Provider Note   CSN: [id number] Arrival date & time: 01/28/17  2331     History   Chief Complaint Chief Complaint  Patient presents with  . Fall    HPI Beverly Hanna is a 36 y.o. female.  The history is provided by the patient.  Knee Pain   This is a new problem. The current episode started 3 to 5 hours ago. The problem occurs constantly. The problem has not changed since onset.The pain is present in the left knee and left hip. The quality of the pain is described as aching. The pain is moderate. Pertinent negatives include no numbness, full range of motion, no stiffness, no tingling and no itching. The symptoms are aggravated by activity and standing. She has tried nothing for the symptoms. The treatment provided no relief. History of extremity trauma: fell. Family history is significant for no gout.    Past Medical History:  Diagnosis Date  . Cervical cancer ([place])   . Substance abuse     There are no active problems to display for this patient.   Past Surgical History:  Procedure Laterality Date  . TUBAL LIGATION      OB History    No data available       Home Medications    Prior to Admission medications   Medication Sig Start Date End Date Taking? Authorizing Provider  clindamycin (CLEOCIN) 300 MG capsule Take 1 capsule (300 mg total) by mouth 2 (two) times daily. X 7 days 01/19/17   Malvin Johns, MD  disulfiram (ANTABUSE) 250 MG tablet Take by mouth daily.    Historical Provider, MD  gabapentin (NEURONTIN) 400 MG capsule Take 400 mg by mouth 3 (three) times daily.    Historical Provider, MD  methocarbamol (ROBAXIN) 500 MG tablet Take 1 tablet (500 mg total) by mouth 2 (two) times daily. Patient not taking: Reported on 12/11/2016 10/13/16   Jarrett Soho Muthersbaugh, PA-C  naproxen (NAPROSYN) 500 MG tablet Take 1 tablet (500 mg total) by mouth 2 (two) times daily with a meal. Patient not taking: Reported on 12/11/2016 10/13/16   Jarrett Soho  Muthersbaugh, PA-C  QUEtiapine (SEROQUEL) 400 MG tablet Take 400 mg by mouth at bedtime.    Historical Provider, MD    Family History No family history on file.  Social History Social History  Substance Use Topics  . Smoking status: Current Some Day Smoker    Types: Cigarettes  . Smokeless tobacco: Never Used  . Alcohol use Yes     Comment: 3 weeks clean     Allergies   Patient has no known allergies.   Review of Systems Review of Systems  Constitutional: Negative for diaphoresis.  Eyes: Negative for photophobia.  Musculoskeletal: Negative for back pain, gait problem, joint swelling, myalgias, neck stiffness and stiffness.  Skin: Negative for itching.  Neurological: Negative for dizziness, tingling, seizures, facial asymmetry, speech difficulty, weakness and numbness.  All other systems reviewed and are negative.    Physical Exam Updated Vital Signs BP (!) 124/91 (BP Location: Right Arm)   Pulse 95   Temp 98.8 F (37.1 C) (Oral)   Resp 18   Ht 5\' 2"  (1.575 m)   Wt 205 lb (93 kg)   SpO2 100%   BMI 37.49 kg/m   Physical Exam  Constitutional: She is oriented to person, place, and time. She appears well-developed and well-nourished. No distress.  HENT:  Head: Normocephalic and atraumatic. Head is without raccoon's eyes and without Battle's sign.  Right Ear: External ear normal. No hemotympanum.  Left Ear: External ear normal. No hemotympanum.  Nose: Nose normal.  Mouth/Throat: No oropharyngeal exudate.  Eyes: Conjunctivae and EOM are normal. Pupils are equal, round, and reactive to light.  Neck: Normal range of motion. Neck supple.  Cardiovascular: Normal rate, regular rhythm, normal heart sounds and intact distal pulses.   Pulmonary/Chest: Effort normal and breath sounds normal.  Abdominal: Soft. Bowel sounds are normal. She exhibits no mass. There is no tenderness. There is no guarding.  Musculoskeletal: Normal range of motion. She exhibits no tenderness or  deformity.       Left hip: Normal.       Left knee: She exhibits normal range of motion, no swelling, no effusion, no ecchymosis, no deformity, no laceration, no erythema, normal alignment, no LCL laxity, normal patellar mobility, no bony tenderness, normal meniscus and no MCL laxity. No tenderness found. No medial joint line, no lateral joint line, no MCL, no LCL and no patellar tendon tenderness noted.       Left ankle: Normal. Achilles tendon normal.       Left foot: Normal.  No foreshortening or external rotation of the left hip. Negative anterior and posterior drawer tests of the left knee, no laxity to varus or valgus stress  Neurological: She is alert and oriented to person, place, and time. She displays normal reflexes.  Skin: Skin is warm and dry. Capillary refill takes less than 2 seconds.  Psychiatric: She has a normal mood and affect.  Nursing note and vitals reviewed.    ED Treatments / Results   Vitals:   01/28/17 2340  BP: (!) 124/91  Pulse: 95  Resp: 18  Temp: 98.8 F (37.1 C)    Radiology Dg Knee Complete 4 Views Left  Result Date: 01/29/2017 CLINICAL DATA:  Left knee pain after injury, fall last night. EXAM: LEFT KNEE - COMPLETE 4+ VIEW COMPARISON:  None. FINDINGS: No evidence of fracture, dislocation, or joint effusion. No evidence of arthropathy or other focal bone abnormality. Soft tissues are unremarkable. IMPRESSION: Negative radiographs of the left knee. Electronically Signed   By: Jeb Levering M.D.   On: 01/29/2017 02:02   Dg Hip Unilat W Or Wo Pelvis 2-3 Views Left  Result Date: 01/29/2017 CLINICAL DATA:  Left hip pain radiating to the left knee after injury, fall last night. EXAM: DG HIP (WITH OR WITHOUT PELVIS) 2-3V LEFT COMPARISON:  None. FINDINGS: The cortical margins of the bony pelvis and left hip are intact. No fracture. Pubic symphysis and sacroiliac joints are congruent. Both femoral heads are well-seated in the respective acetabula. No focal bony  abnormality. IMPRESSION: Negative radiographs of the pelvis and left hip. Electronically Signed   By: Jeb Levering M.D.   On: 01/29/2017 02:01    Procedures Procedures (including critical care time)  Medications Ordered in ED Medications  ibuprofen (ADVIL,MOTRIN) tablet 800 mg (800 mg Oral Given 01/29/17 0229)  acetaminophen (TYLENOL) tablet 1,000 mg (1,000 mg Oral Given 01/29/17 0229)      Final Clinical Impressions(s) / ED Diagnoses  Contusion to the knee post fall: did not hit head no LOC.  Placed in a knee sleeve for comfort.  Ice and elevate the leg when not on it.   Follow up with your PMD.  Exam and vitals are benign and reassuring.  I do not feel advanced imaging is necessary at this time. The patient is nontoxic-appearing and imaging is negative for acute finding.Return for weakness or  numbness, pain or any concerns.   After history, exam, and medical workup I feel the patient has been appropriately medically screened and is safe for discharge home. Pertinent diagnoses were discussed with the patient. Patient was given return precautions. New Prescriptions New Prescriptions   No medications on file     Vaishnav Demartin, MD 01/29/17 [phone]

## 2017-01-29 NOTE — ED Notes (Signed)
PT is postmenopausal from chemotherapy. PT states she is unable to get pregnant.

## 2017-02-12 ENCOUNTER — Encounter (HOSPITAL_COMMUNITY): Payer: Self-pay | Admitting: *Deleted

## 2017-02-12 DIAGNOSIS — H66001 Acute suppurative otitis media without spontaneous rupture of ear drum, right ear: Secondary | ICD-10-CM | POA: Diagnosis not present

## 2017-02-12 DIAGNOSIS — Z8541 Personal history of malignant neoplasm of cervix uteri: Secondary | ICD-10-CM | POA: Insufficient documentation

## 2017-02-12 DIAGNOSIS — Z79899 Other long term (current) drug therapy: Secondary | ICD-10-CM | POA: Diagnosis not present

## 2017-02-12 DIAGNOSIS — F17210 Nicotine dependence, cigarettes, uncomplicated: Secondary | ICD-10-CM | POA: Insufficient documentation

## 2017-02-12 DIAGNOSIS — R112 Nausea with vomiting, unspecified: Secondary | ICD-10-CM | POA: Diagnosis present

## 2017-02-12 NOTE — ED Triage Notes (Signed)
Pt has multiple complaints. She reports she has been having right ear pain for several days, swelling in her legs and feet with "yellow" under her feet. Has had issues with constipation (LBM 1.5 days ago), vomited x 1 today and has had nausea. Reports of rash on the right side of her face and tension in her right neck.

## 2017-02-12 NOTE — ED Triage Notes (Signed)
Pt is from home, arrives via ems. Per report, pt has c/o right ear pain, rash, boil under the arm, n/v, and stepped on a nail on the left foot. VSS en route to ED

## 2017-02-13 ENCOUNTER — Emergency Department (HOSPITAL_COMMUNITY)
Admission: EM | Admit: 2017-02-13 | Discharge: 2017-02-13 | Disposition: A | Discharge status: Home / Self Care | Payer: Medicaid Other | Attending: Emergency Medicine | Admitting: Emergency Medicine

## 2017-02-13 DIAGNOSIS — H66001 Acute suppurative otitis media without spontaneous rupture of ear drum, right ear: Secondary | ICD-10-CM

## 2017-02-13 DIAGNOSIS — R1115 Cyclical vomiting syndrome unrelated to migraine: Secondary | ICD-10-CM

## 2017-02-13 LAB — URINALYSIS, ROUTINE W REFLEX MICROSCOPIC
Glucose, UA: NEGATIVE mg/dL
Ketones, ur: 5 mg/dL — AB
NITRITE: NEGATIVE
PH: 5 (ref 5.0–8.0)
Protein, ur: 30 mg/dL — AB
SPECIFIC GRAVITY, URINE: 1.027 (ref 1.005–1.030)

## 2017-02-13 LAB — CBC
HCT: 40.8 % (ref 36.0–46.0)
Hemoglobin: 13.3 g/dL (ref 12.0–15.0)
MCH: 27.5 pg (ref 26.0–34.0)
MCHC: 32.6 g/dL (ref 30.0–36.0)
MCV: 84.3 fL (ref 78.0–100.0)
PLATELETS: 279 10*3/uL (ref 150–400)
RBC: 4.84 MIL/uL (ref 3.87–5.11)
RDW: 13.7 % (ref 11.5–15.5)
WBC: 6.8 10*3/uL (ref 4.0–10.5)

## 2017-02-13 LAB — COMPREHENSIVE METABOLIC PANEL
ALK PHOS: 76 U/L (ref 38–126)
ALT: 30 U/L (ref 14–54)
AST: 31 U/L (ref 15–41)
Albumin: 3.8 g/dL (ref 3.5–5.0)
Anion gap: 11 (ref 5–15)
BILIRUBIN TOTAL: 1 mg/dL (ref 0.3–1.2)
BUN: 13 mg/dL (ref 6–20)
CALCIUM: 9.4 mg/dL (ref 8.9–10.3)
CO2: 25 mmol/L (ref 22–32)
CREATININE: 1.31 mg/dL — AB (ref 0.44–1.00)
Chloride: 100 mmol/L — ABNORMAL LOW (ref 101–111)
GFR calc non Af Amer: 52 mL/min — ABNORMAL LOW (ref >60)
GFR, EST AFRICAN AMERICAN: 60 mL/min — AB (ref >60)
GLUCOSE: 117 mg/dL — AB (ref 65–99)
Potassium: 3.7 mmol/L (ref 3.5–5.1)
SODIUM: 136 mmol/L (ref 135–145)
TOTAL PROTEIN: 7.6 g/dL (ref 6.5–8.1)

## 2017-02-13 LAB — LIPASE, BLOOD: Lipase: 18 U/L (ref 11–51)

## 2017-02-13 LAB — POC URINE PREG, ED: Preg Test, Ur: NEGATIVE

## 2017-02-13 MED ORDER — AMOXICILLIN 500 MG PO CAPS: 500.0000 mg | ORAL_CAPSULE | Freq: Three times a day (TID) | ORAL | 0 refills | Status: AC

## 2017-02-13 MED ORDER — AMOXICILLIN 500 MG PO CAPS
500.0000 mg | ORAL_CAPSULE | Freq: Once | ORAL | Status: AC
  Administered 2017-02-13: 500 mg via ORAL
  Filled 2017-02-13: qty 1

## 2017-02-13 MED ORDER — ONDANSETRON 4 MG PO TBDP
8.0000 mg | ORAL_TABLET | Freq: Once | ORAL | Status: AC
  Administered 2017-02-13: 8 mg via ORAL
  Filled 2017-02-13: qty 2

## 2017-02-13 MED ORDER — IBUPROFEN 800 MG PO TABS
800.0000 mg | ORAL_TABLET | Freq: Once | ORAL | Status: AC
  Administered 2017-02-13: 800 mg via ORAL
  Filled 2017-02-13: qty 1

## 2017-02-13 NOTE — ED Provider Notes (Signed)
[facility] DEPT Provider Note   CSN: [id number] Arrival date & time: 02/12/17  2342   By signing my name below, I, Charolotte Eke, attest that this documentation has been prepared under the direction and in the presence of Ripley Fraise, MD. Electronically Signed: Charolotte Eke, Scribe. 02/13/17. 1:46 AM.    History   Chief Complaint Chief Complaint  Patient presents with  . Emesis    HPI Beverly Hanna is a 36 y.o. female.  Pt is c/o otalgia in her left ear that started 2 days ago. Pt has associated constipation. Pt denies fever, abdominal pain, CP, SOB, diarrhea. Pt has h/o cervical CA and was treated with chemo and radiation. Pt has been treated for syphilis within the last year.    The history is provided by the patient. No language interpreter was used.  Otalgia  This is a new problem. The current episode started 2 days ago. There is pain in the right ear. The problem occurs constantly. The problem has not changed since onset.There has been no fever. The pain is at a severity of 8/10. The pain is moderate. Associated symptoms include vomiting. Pertinent negatives include no hearing loss, no abdominal pain and no diarrhea.  Foot Pain  This is a new problem. The current episode started more than 2 days ago. The problem occurs constantly. The problem has not changed since onset.Pertinent negatives include no chest pain, no abdominal pain and no shortness of breath. Associated symptoms comments: Swelling at the tips of the toes..    Past Medical History:  Diagnosis Date  . Cervical cancer ([place])   . Substance abuse     There are no active problems to display for this patient.   Past Surgical History:  Procedure Laterality Date  . TUBAL LIGATION      OB History    No data available       Home Medications    Prior to Admission medications   Medication Sig Start Date End Date Taking? Authorizing Provider  clindamycin (CLEOCIN) 300 MG capsule Take 1 capsule (300 mg  total) by mouth 2 (two) times daily. X 7 days 01/19/17   Malvin Johns, MD  disulfiram (ANTABUSE) 250 MG tablet Take by mouth daily.    Historical Provider, MD  gabapentin (NEURONTIN) 400 MG capsule Take 400 mg by mouth 3 (three) times daily.    Historical Provider, MD  ibuprofen (ADVIL,MOTRIN) 800 MG tablet Take 1 tablet (800 mg total) by mouth 3 (three) times daily. 01/29/17   April Palumbo, MD  methocarbamol (ROBAXIN) 500 MG tablet Take 1 tablet (500 mg total) by mouth 2 (two) times daily. Patient not taking: Reported on 12/11/2016 10/13/16   Jarrett Soho Muthersbaugh, PA-C  naproxen (NAPROSYN) 500 MG tablet Take 1 tablet (500 mg total) by mouth 2 (two) times daily with a meal. Patient not taking: Reported on 12/11/2016 10/13/16   Jarrett Soho Muthersbaugh, PA-C  QUEtiapine (SEROQUEL) 400 MG tablet Take 400 mg by mouth at bedtime.    Historical Provider, MD    Family History No family history on file.  Social History Social History  Substance Use Topics  . Smoking status: Current Some Day Smoker    Types: Cigarettes  . Smokeless tobacco: Never Used  . Alcohol use Yes     Comment: 3 weeks clean     Allergies   Patient has no known allergies.   Review of Systems Review of Systems  HENT: Positive for ear pain. Negative for hearing loss.   Respiratory: Negative  for shortness of breath.   Cardiovascular: Negative for chest pain.  Gastrointestinal: Positive for vomiting. Negative for abdominal pain and diarrhea.  All other systems reviewed and are negative.    Physical Exam Updated Vital Signs BP 122/79 (BP Location: Left Arm)   Pulse 98   Temp 98 F (36.7 C) (Oral)   Resp 16   SpO2 94%   Physical Exam CONSTITUTIONAL: Well developed/well nourished; anxious HEAD: Normocephalic/atraumatic EYES: EOMI/PERRL ENMT: Mucous membranes moist; TM bulging and erythematous; no rash or discharge; ears are symmetric NECK: supple no meningeal signs SPINE/BACK:entire spine nontender CV: S1/S2 noted, no  murmurs/rubs/gallops noted LUNGS: Lungs are clear to auscultation bilaterally, no apparent distress ABDOMEN: soft, nontender, no rebound or guarding, bowel sounds noted throughout abdomen GU:no cva tenderness NEURO: Pt is awake/alert/appropriate, moves all extremitiesx4.  No facial droop.   EXTREMITIES: pulses normal/equal, full ROM; mild tenderness to left foot. No bruising, erythema, or puncture wounds. No abscess. SKIN: warm, color normal PSYCH:  anxious  ED Treatments / Results   DIAGNOSTIC STUDIES: Oxygen Saturation is 94% on room air , adequate by my interpretation.    COORDINATION OF CARE: 1:44 AM Discussed treatment plan with pt at bedside and pt agreed to plan.    Labs (all labs ordered are listed, but only abnormal results are displayed) Labs Reviewed  COMPREHENSIVE METABOLIC PANEL - Abnormal; Notable for the following:       Result Value   Chloride 100 (*)    Glucose, Bld 117 (*)    Creatinine, Ser 1.31 (*)    GFR calc non Af Amer 52 (*)    GFR calc Af Amer 60 (*)    All other components within normal limits  URINALYSIS, ROUTINE W REFLEX MICROSCOPIC - Abnormal; Notable for the following:    Color, Urine AMBER (*)    APPearance CLOUDY (*)    Hgb urine dipstick SMALL (*)    Bilirubin Urine SMALL (*)    Ketones, ur 5 (*)    Protein, ur 30 (*)    Leukocytes, UA TRACE (*)    Bacteria, UA RARE (*)    Squamous Epithelial / LPF 6-30 (*)    All other components within normal limits  LIPASE, BLOOD  CBC  POC URINE PREG, ED    EKG  EKG Interpretation None       Radiology No results found.  Procedures Procedures  Medications Ordered in ED Medications  amoxicillin (AMOXIL) capsule 500 mg (500 mg Oral Given 02/13/17 0215)  ondansetron (ZOFRAN-ODT) disintegrating tablet 8 mg (8 mg Oral Given 02/13/17 0214)  ibuprofen (ADVIL,MOTRIN) tablet 800 mg (800 mg Oral Given 02/13/17 0215)     Initial Impression / Assessment and Plan / ED Course  I have reviewed the  triage vital signs and the nursing notes.  Pertinent labs  results that were available during my care of the patient were reviewed by me and considered in my medical decision making (see chart for details).     Pt reports she is mostly here for right ear pain She appears to have otitis without complication She reports dental pain but no abscess Anxious appearing but otherwise no neuro deficits, no distress She also mentions left foot pain but denies trauma and no signs of trauma/infection  Final Clinical Impressions(s) / ED Diagnoses   Final diagnoses:  Non-intractable cyclical vomiting with nausea  Acute suppurative otitis media of right ear without spontaneous rupture of tympanic membrane, recurrence not specified    New Prescriptions New Prescriptions  AMOXICILLIN (AMOXIL) 500 MG CAPSULE    Take 1 capsule (500 mg total) by mouth 3 (three) times daily.   I personally performed the services described in this documentation, which was scribed in my presence. The recorded information has been reviewed and is accurate.        Ripley Fraise, MD 02/13/17 [phone]

## 2017-02-13 NOTE — ED Notes (Signed)
Pt just called out and is complaining of right hand pain.

## 2017-02-13 NOTE — ED Provider Notes (Signed)
No rash noted to face No puncture wounds to foot, no signs of abscess/cellulitis  (pt had mentioned this to nursing previously)   Ripley Fraise, MD 02/13/17 [phone]

## 2017-02-13 NOTE — ED Notes (Signed)
See doctors and triage nurse notes  Doctor saw beore mysel

## 2017-07-18 IMAGING — CR DG HIP (WITH OR WITHOUT PELVIS) 2-3V*L*
3 series · 3 of 3 positions shown · non-contrast
Comparison: None.

CLINICAL DATA: Left hip pain radiating to the left knee after
injury, fall last night.

EXAM:
DG HIP (WITH OR WITHOUT PELVIS) 2-3V LEFT

[t pelvis a.p.]
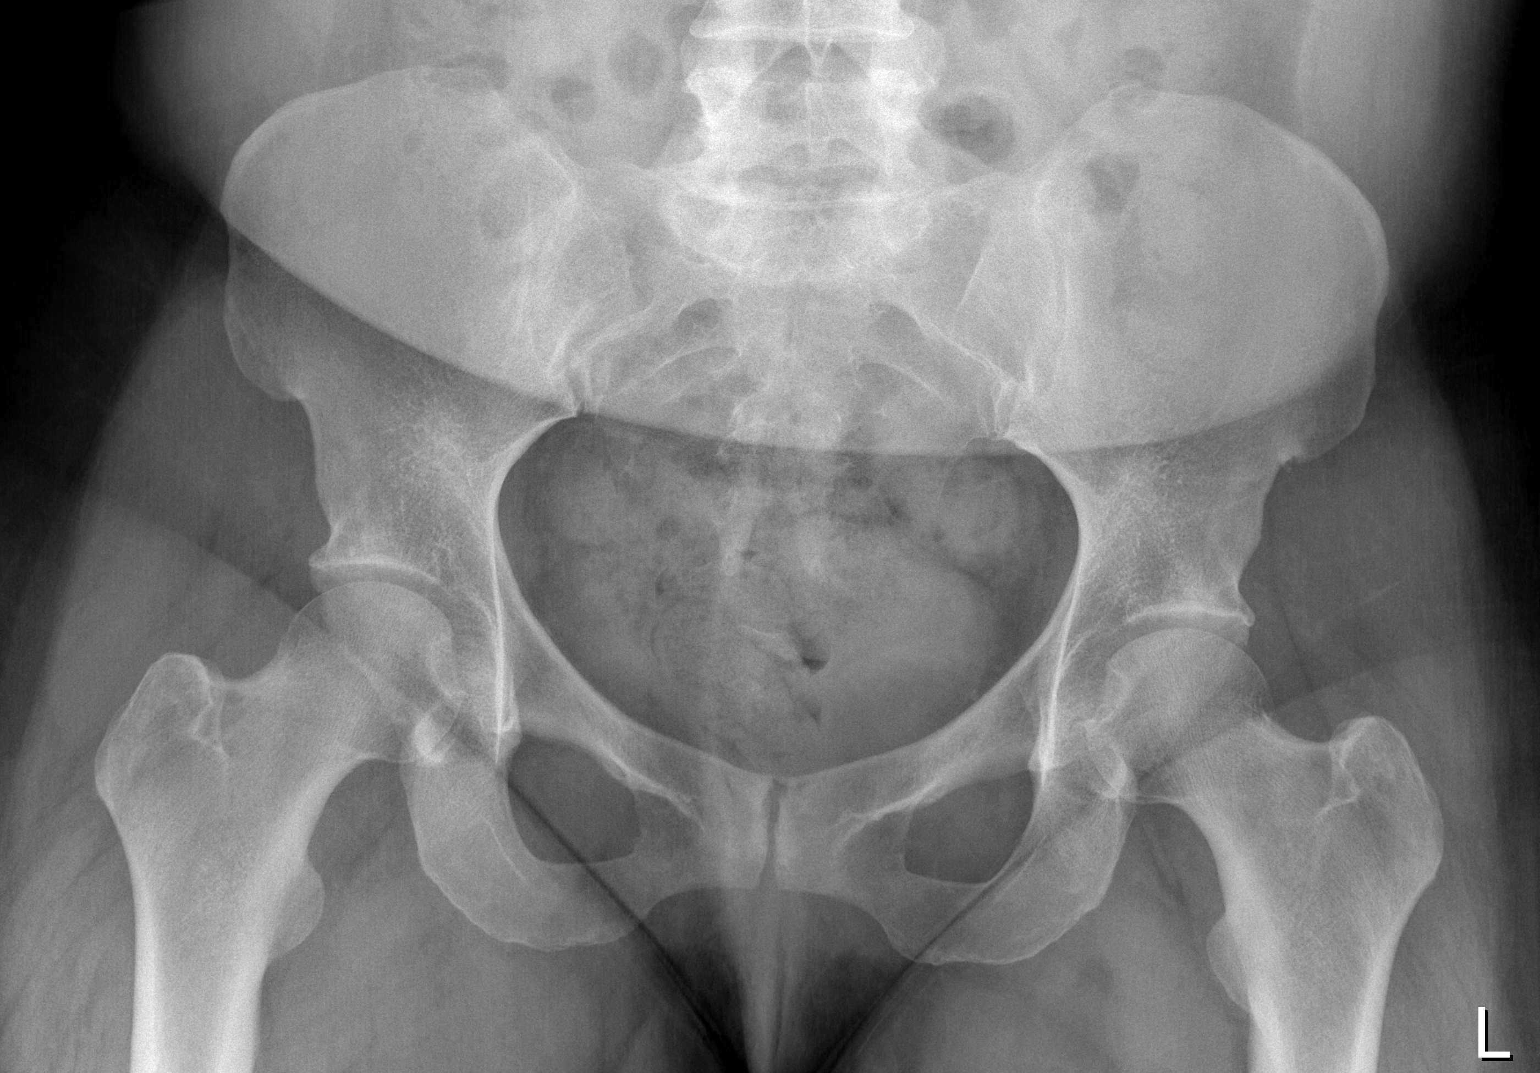

[t hip ap left]
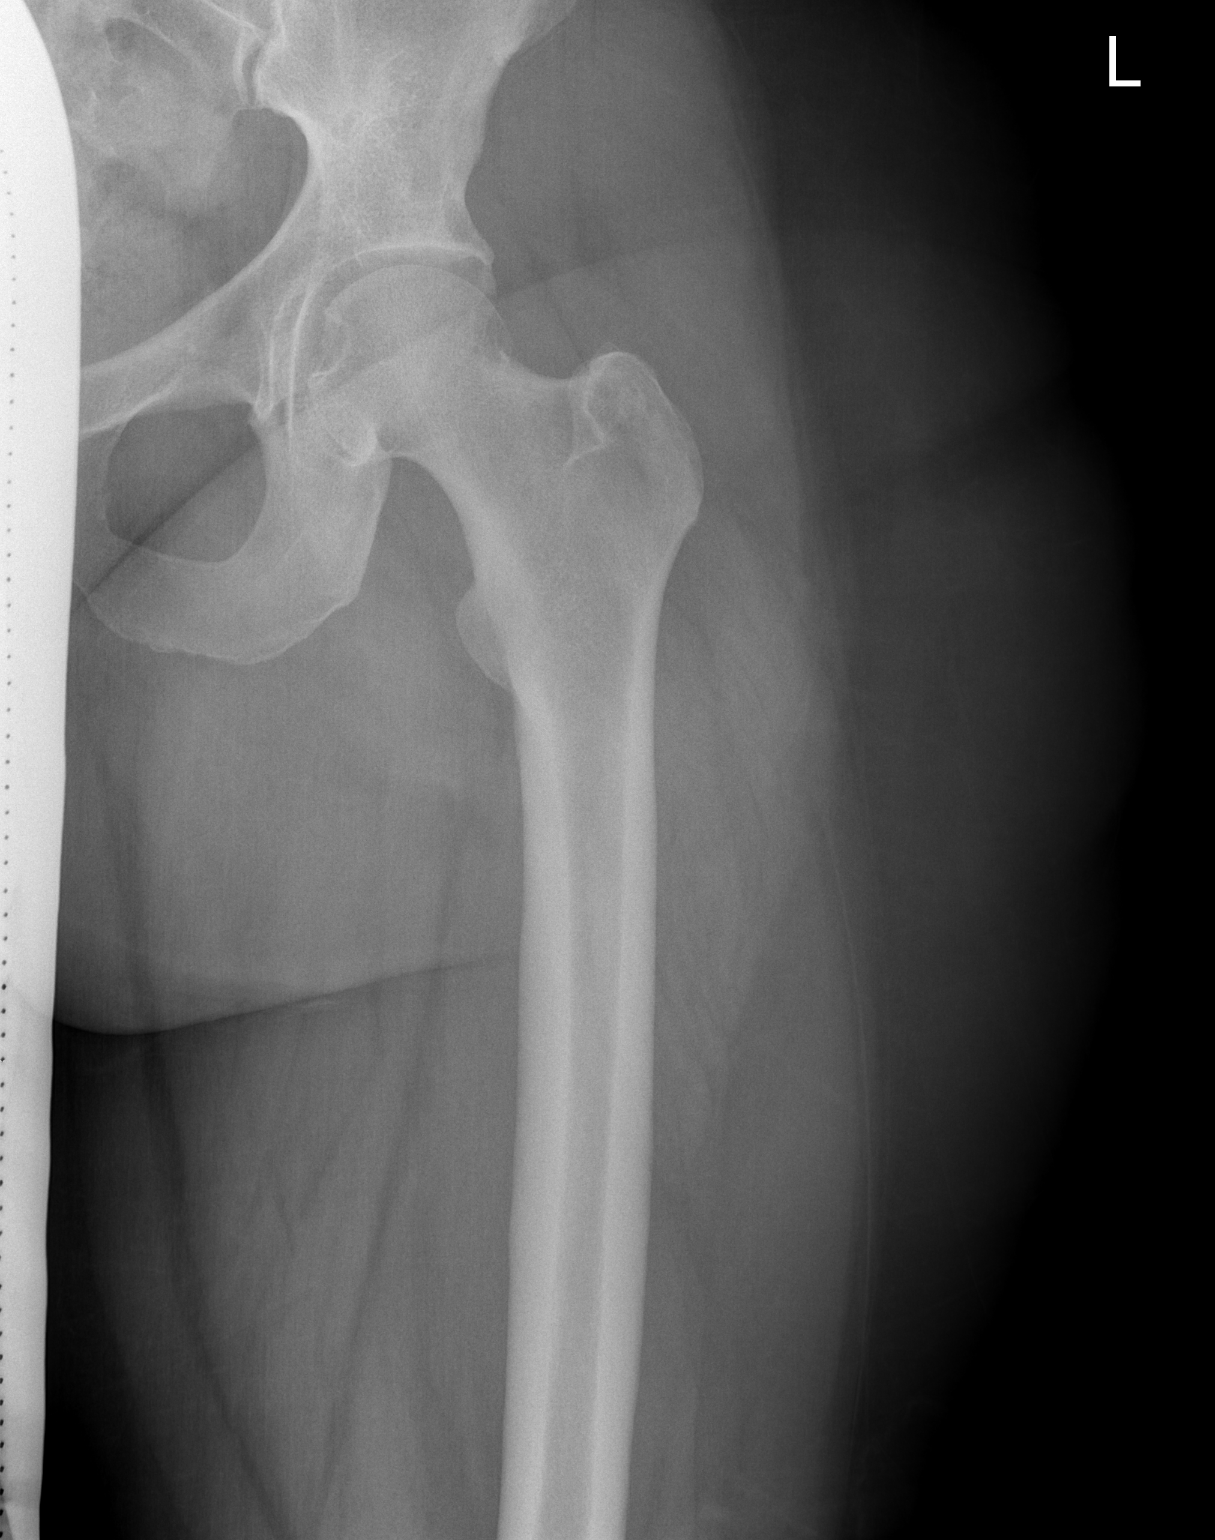

[t hip frog leg left]
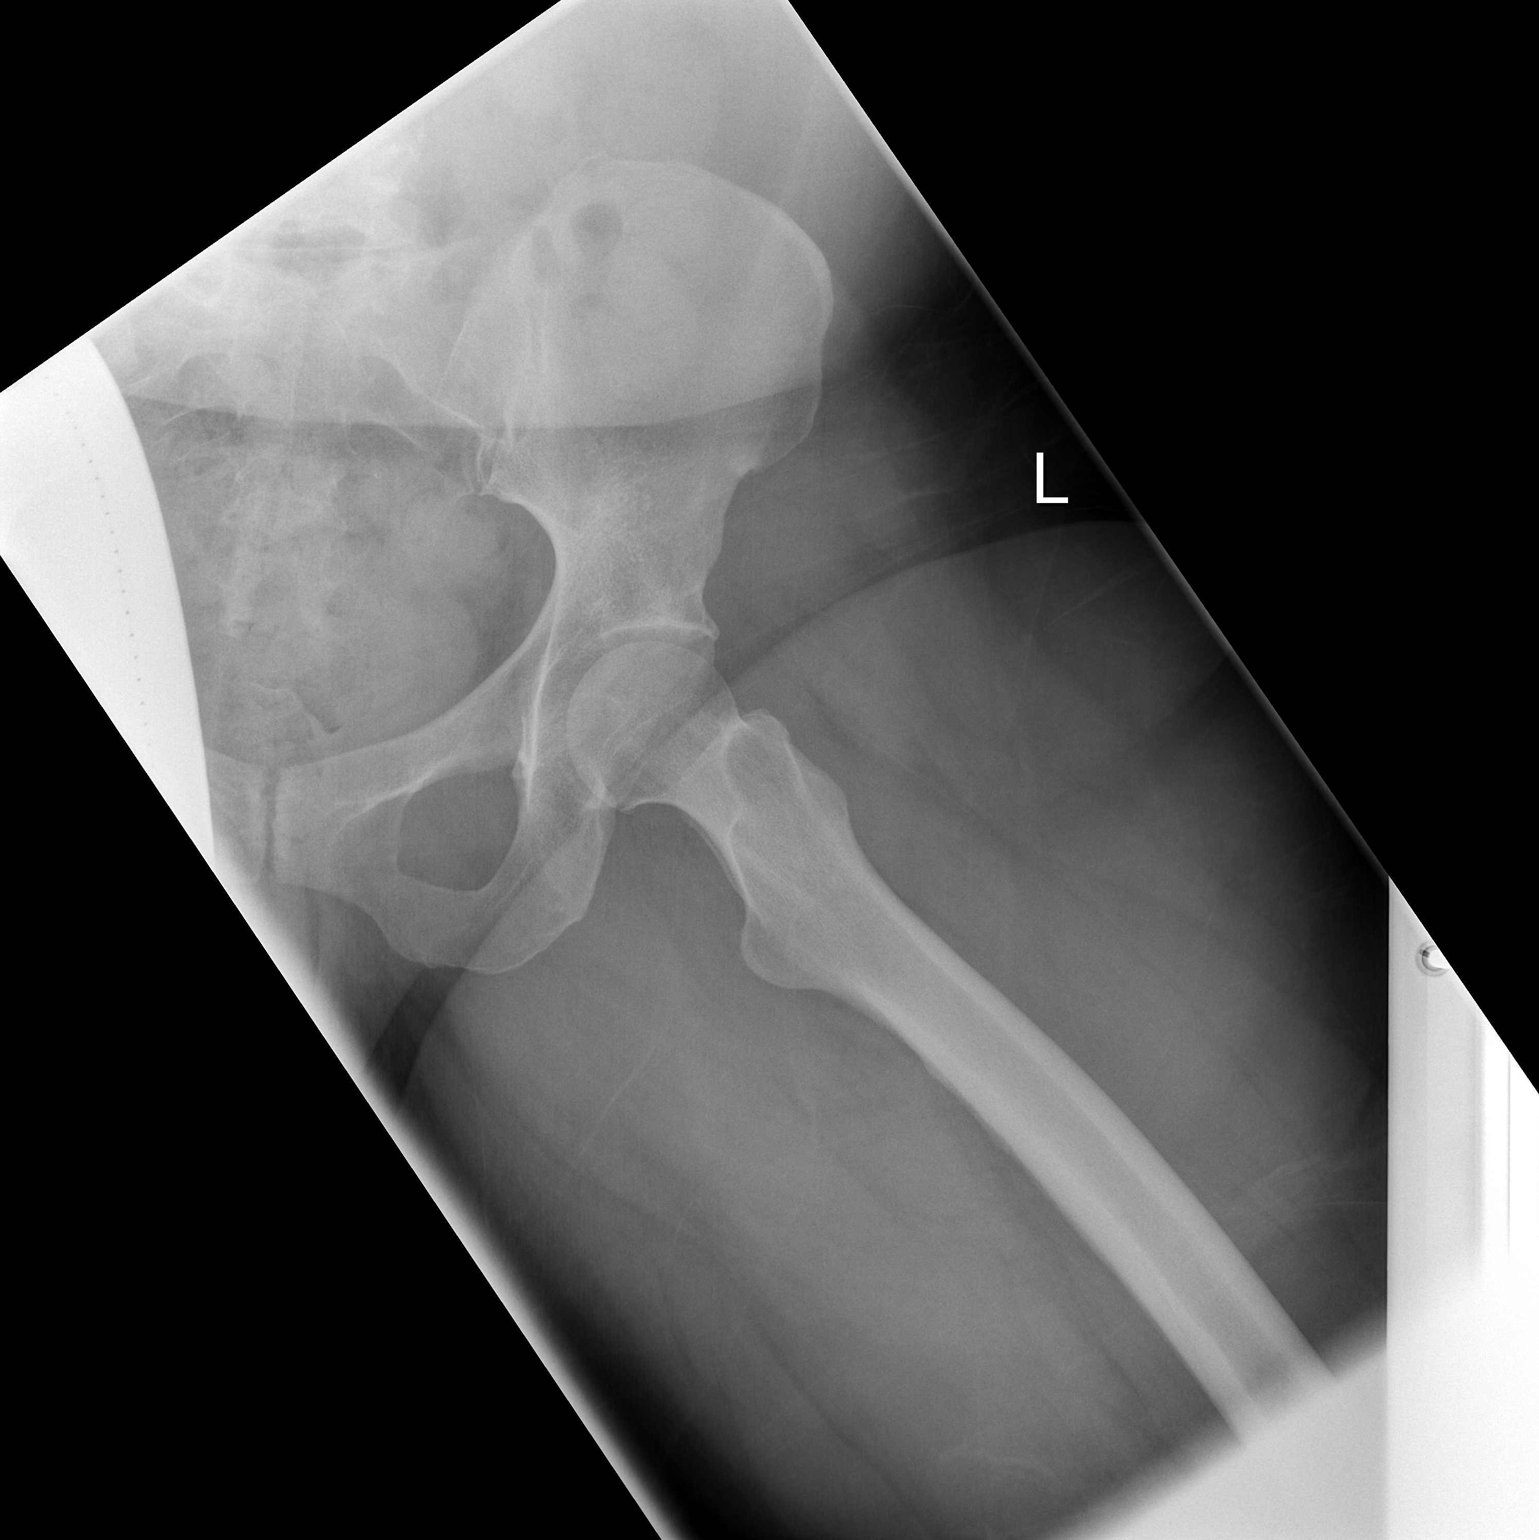

[3 of 3 positions shown; findings below may reference images not displayed]

FINDINGS: The cortical margins of the bony pelvis and left hip are intact. No
fracture. Pubic symphysis and sacroiliac joints are congruent. Both
femoral heads are well-seated in the respective acetabula. No focal
bony abnormality.
IMPRESSION: Negative radiographs of the pelvis and left hip.

## 2017-07-18 IMAGING — CR DG KNEE COMPLETE 4+V*L*
4 series · 4 of 4 positions shown · non-contrast
Comparison: None.

CLINICAL DATA: Left knee pain after injury, fall last night.

EXAM:
LEFT KNEE - COMPLETE 4+ VIEW

[t knee ap left]
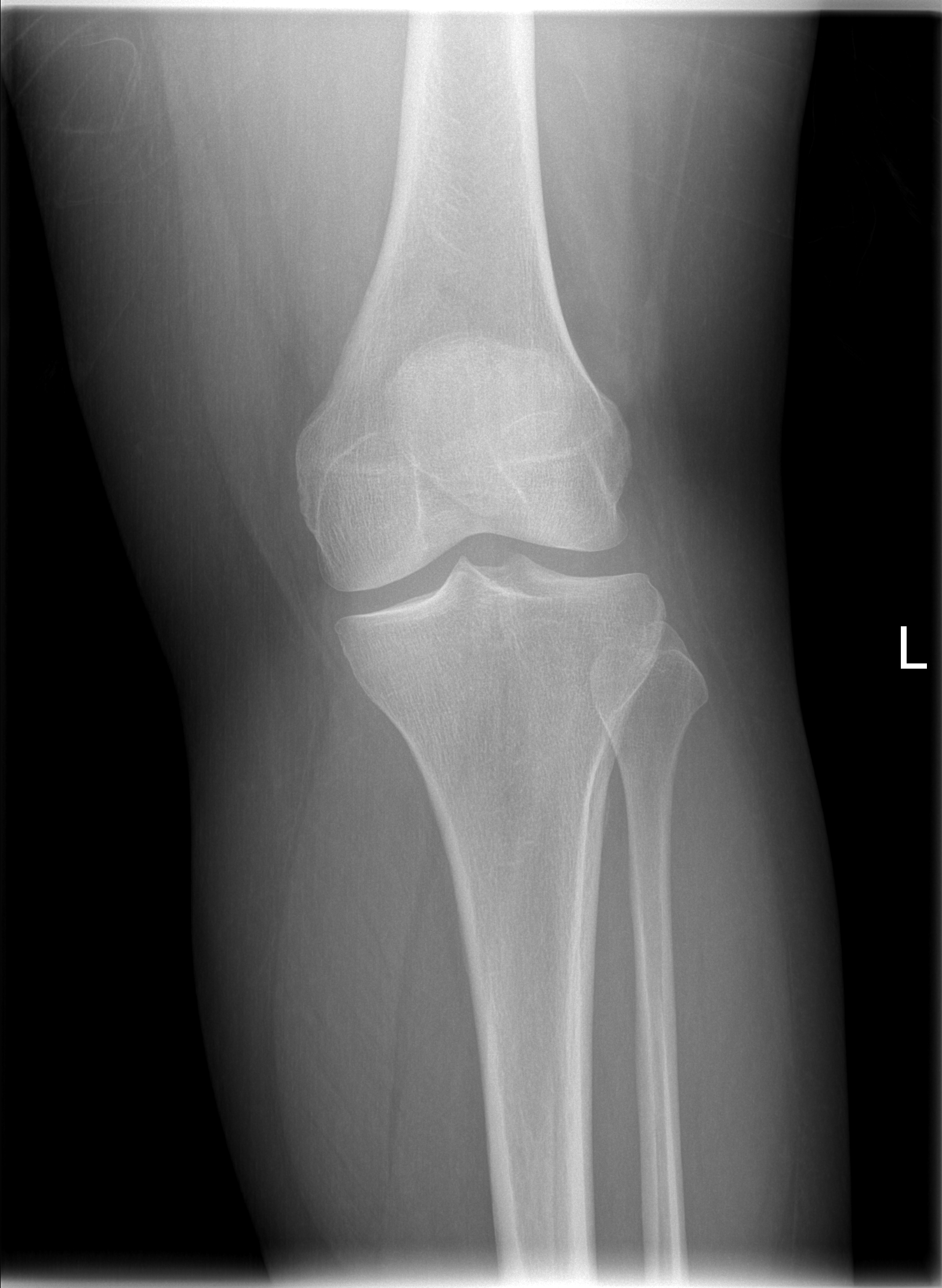

[t knee oblique left (1 of 2)]
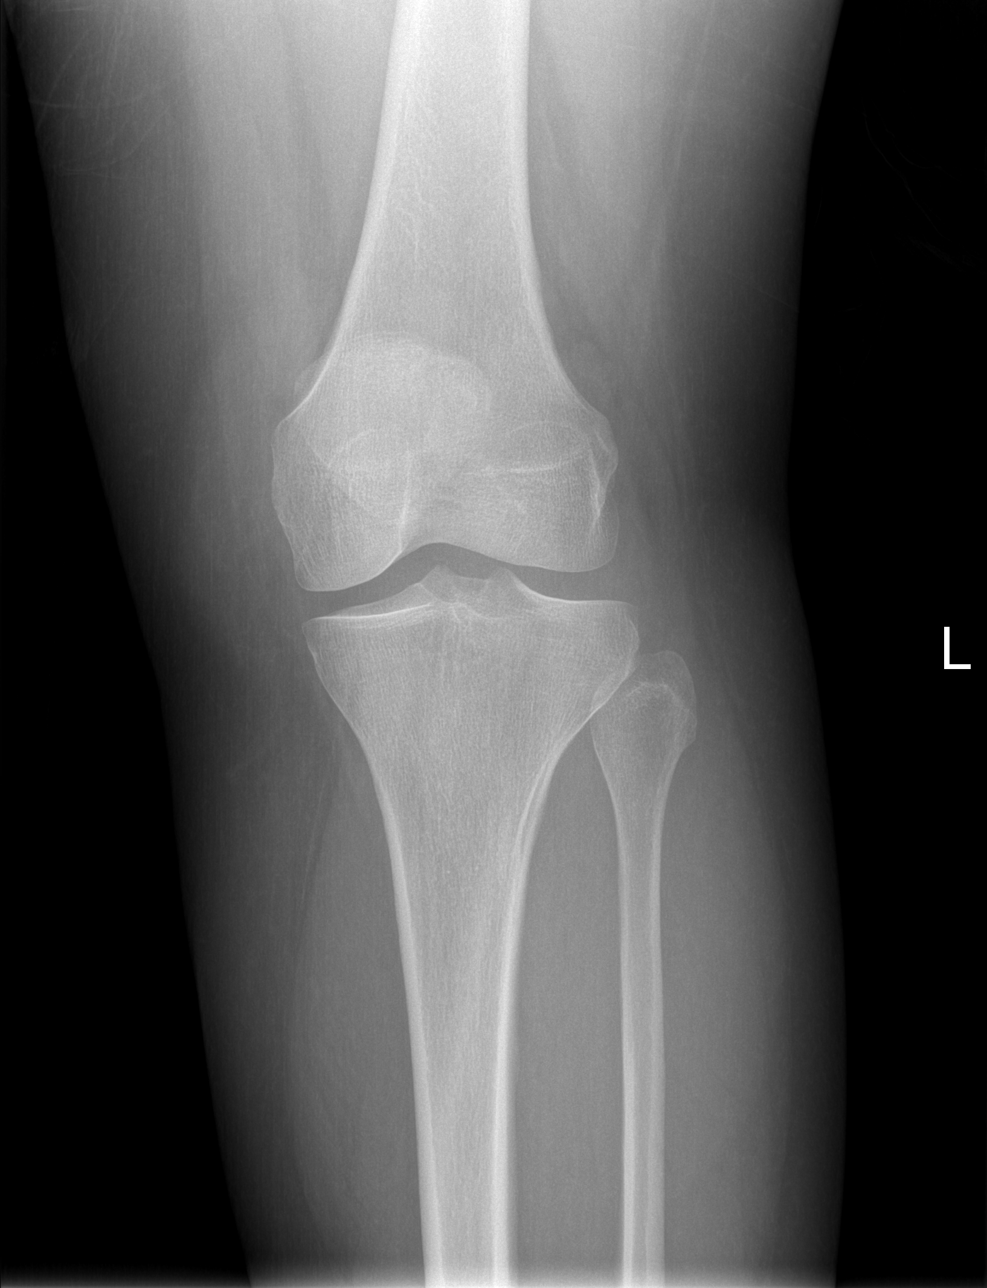

[t knee oblique left (2 of 2)]
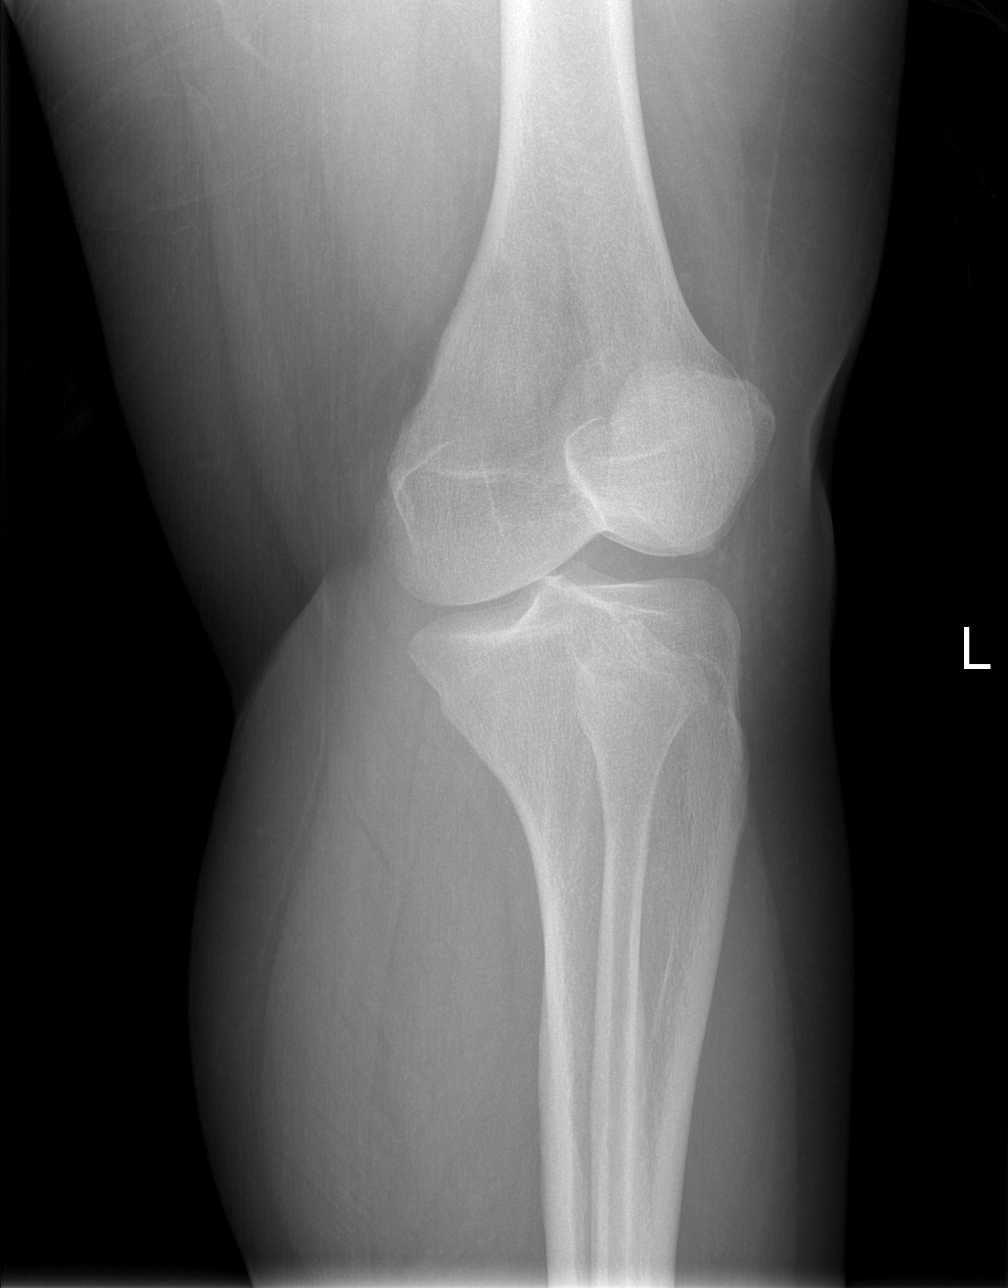

[t knee lat left]
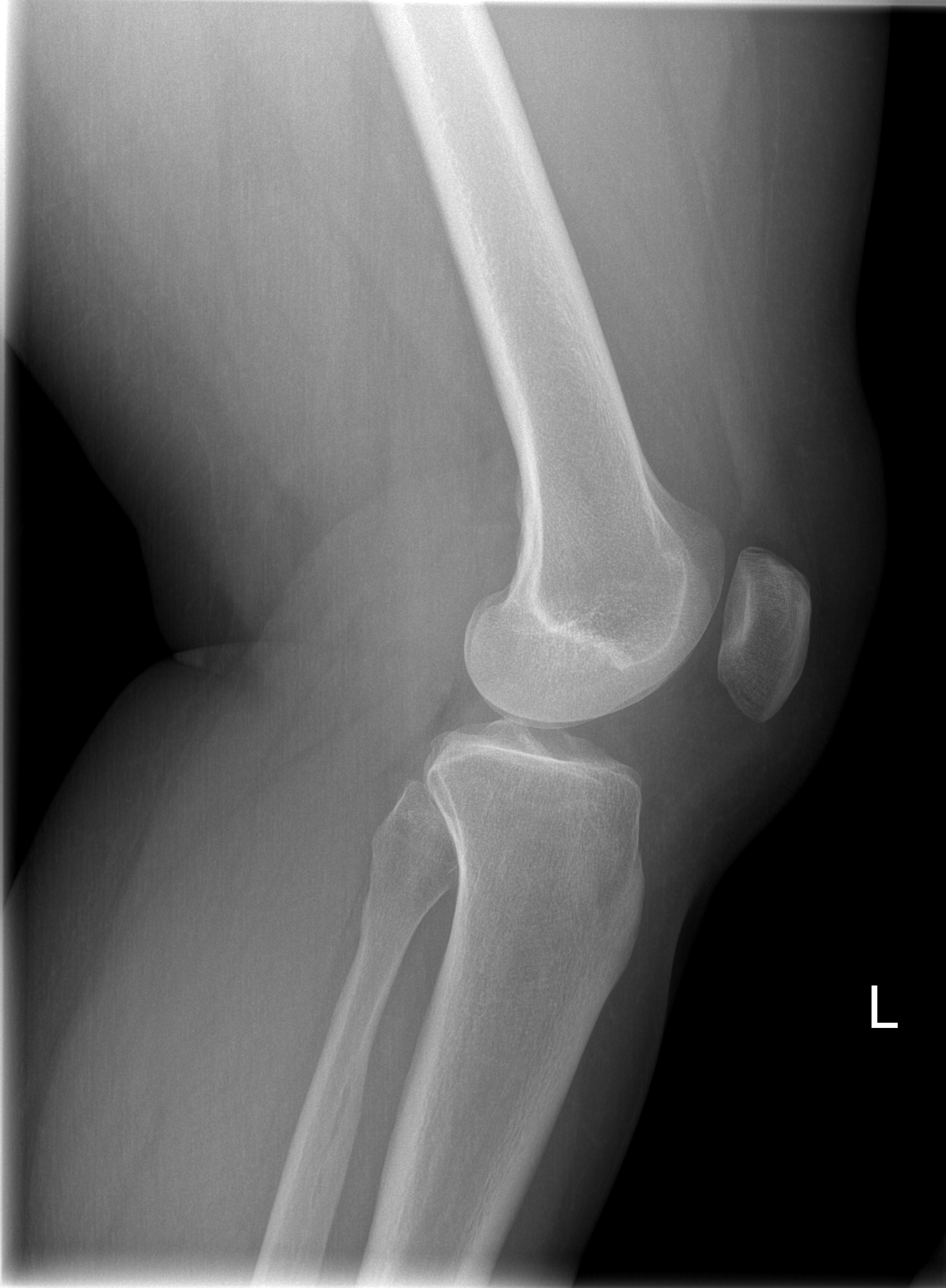

[4 of 4 positions shown; findings below may reference images not displayed]

FINDINGS: No evidence of fracture, dislocation, or joint effusion. No evidence
of arthropathy or other focal bone abnormality. Soft tissues are
unremarkable.
IMPRESSION: Negative radiographs of the left knee.
